# Patient Record
Sex: Female | Born: 1955 | Race: White | Hispanic: No | Marital: Married
Health system: Southern US, Community
[De-identification: ages and names within clinical notes are randomized; demographics above are authoritative.]

## PROBLEM LIST (undated history)

## (undated) DIAGNOSIS — J45909 Unspecified asthma, uncomplicated: Secondary | ICD-10-CM

## (undated) HISTORY — DX: Unspecified asthma, uncomplicated: J45.909

## (undated) HISTORY — PX: CHOLECYSTECTOMY: SHX55

---

## 2016-02-19 DIAGNOSIS — Z131 Encounter for screening for diabetes mellitus: Secondary | ICD-10-CM | POA: Diagnosis not present

## 2016-02-19 DIAGNOSIS — Z13220 Encounter for screening for lipoid disorders: Secondary | ICD-10-CM | POA: Diagnosis not present

## 2016-02-19 DIAGNOSIS — Z0000 Encounter for general adult medical examination without abnormal findings: Secondary | ICD-10-CM | POA: Diagnosis not present

## 2016-02-19 DIAGNOSIS — Z23 Encounter for immunization: Secondary | ICD-10-CM | POA: Diagnosis not present

## 2016-02-19 DIAGNOSIS — Z1211 Encounter for screening for malignant neoplasm of colon: Secondary | ICD-10-CM | POA: Diagnosis not present

## 2016-03-25 ENCOUNTER — Other Ambulatory Visit: Payer: Self-pay | Admitting: Family Medicine

## 2016-03-25 DIAGNOSIS — Z1231 Encounter for screening mammogram for malignant neoplasm of breast: Secondary | ICD-10-CM

## 2016-03-25 DIAGNOSIS — M8588 Other specified disorders of bone density and structure, other site: Secondary | ICD-10-CM | POA: Diagnosis not present

## 2016-03-25 DIAGNOSIS — Z780 Asymptomatic menopausal state: Secondary | ICD-10-CM | POA: Diagnosis not present

## 2016-03-31 ENCOUNTER — Ambulatory Visit
Admission: RE | Admit: 2016-03-31 | Discharge: 2016-03-31 | Disposition: A | Discharge status: Home / Self Care | Payer: PRIVATE HEALTH INSURANCE | Source: Ambulatory Visit | Attending: Family Medicine | Admitting: Family Medicine

## 2016-03-31 DIAGNOSIS — Z1231 Encounter for screening mammogram for malignant neoplasm of breast: Secondary | ICD-10-CM | POA: Diagnosis not present

## 2016-05-22 DIAGNOSIS — Z23 Encounter for immunization: Secondary | ICD-10-CM | POA: Diagnosis not present

## 2017-03-19 ENCOUNTER — Other Ambulatory Visit: Payer: Self-pay | Admitting: Family Medicine

## 2017-03-19 DIAGNOSIS — Z1231 Encounter for screening mammogram for malignant neoplasm of breast: Secondary | ICD-10-CM

## 2017-03-31 DIAGNOSIS — Z961 Presence of intraocular lens: Secondary | ICD-10-CM | POA: Diagnosis not present

## 2017-03-31 DIAGNOSIS — H5000 Unspecified esotropia: Secondary | ICD-10-CM | POA: Diagnosis not present

## 2017-03-31 DIAGNOSIS — H26491 Other secondary cataract, right eye: Secondary | ICD-10-CM | POA: Diagnosis not present

## 2017-04-01 ENCOUNTER — Encounter: Payer: Self-pay | Admitting: Radiology

## 2017-04-01 ENCOUNTER — Ambulatory Visit
Admission: RE | Admit: 2017-04-01 | Discharge: 2017-04-01 | Disposition: A | Discharge status: Home / Self Care | Payer: BLUE CROSS/BLUE SHIELD | Source: Ambulatory Visit | Attending: Family Medicine | Admitting: Family Medicine

## 2017-04-01 DIAGNOSIS — Z1231 Encounter for screening mammogram for malignant neoplasm of breast: Secondary | ICD-10-CM | POA: Diagnosis not present

## 2017-05-21 DIAGNOSIS — Z23 Encounter for immunization: Secondary | ICD-10-CM | POA: Diagnosis not present

## 2017-09-27 DIAGNOSIS — J45909 Unspecified asthma, uncomplicated: Secondary | ICD-10-CM | POA: Diagnosis not present

## 2017-09-27 DIAGNOSIS — Z1211 Encounter for screening for malignant neoplasm of colon: Secondary | ICD-10-CM | POA: Diagnosis not present

## 2017-11-01 DIAGNOSIS — Z961 Presence of intraocular lens: Secondary | ICD-10-CM | POA: Diagnosis not present

## 2017-11-01 DIAGNOSIS — H5213 Myopia, bilateral: Secondary | ICD-10-CM | POA: Diagnosis not present

## 2017-11-01 DIAGNOSIS — H5000 Unspecified esotropia: Secondary | ICD-10-CM | POA: Diagnosis not present

## 2017-11-01 DIAGNOSIS — H26492 Other secondary cataract, left eye: Secondary | ICD-10-CM | POA: Diagnosis not present

## 2017-12-24 DIAGNOSIS — Z01419 Encounter for gynecological examination (general) (routine) without abnormal findings: Secondary | ICD-10-CM | POA: Diagnosis not present

## 2018-01-03 DIAGNOSIS — J029 Acute pharyngitis, unspecified: Secondary | ICD-10-CM | POA: Diagnosis not present

## 2018-01-05 DIAGNOSIS — R631 Polydipsia: Secondary | ICD-10-CM | POA: Diagnosis not present

## 2018-01-05 DIAGNOSIS — R5383 Other fatigue: Secondary | ICD-10-CM | POA: Diagnosis not present

## 2018-01-05 DIAGNOSIS — J029 Acute pharyngitis, unspecified: Secondary | ICD-10-CM | POA: Diagnosis not present

## 2018-01-25 DIAGNOSIS — D473 Essential (hemorrhagic) thrombocythemia: Secondary | ICD-10-CM | POA: Diagnosis not present

## 2018-02-10 ENCOUNTER — Encounter: Payer: Self-pay | Admitting: Internal Medicine

## 2018-02-10 ENCOUNTER — Telehealth: Payer: Self-pay | Admitting: Internal Medicine

## 2018-02-10 NOTE — Telephone Encounter (Signed)
New referral received from Dr. Orland Mustard at [facility] for a dx of thrombocytosis. Pt has been scheduled to see Dr. Julien Nordmann on 7/8 at 1130am. Pt aware to arrive 30 minutes early. Letter mailed.

## 2018-02-21 ENCOUNTER — Other Ambulatory Visit: Payer: Self-pay | Admitting: Family Medicine

## 2018-02-21 ENCOUNTER — Telehealth: Payer: Self-pay | Admitting: Internal Medicine

## 2018-02-21 ENCOUNTER — Other Ambulatory Visit: Payer: Self-pay | Admitting: Medical Oncology

## 2018-02-21 ENCOUNTER — Inpatient Hospital Stay: Payer: BLUE CROSS/BLUE SHIELD

## 2018-02-21 ENCOUNTER — Encounter: Payer: Self-pay | Admitting: Internal Medicine

## 2018-02-21 ENCOUNTER — Inpatient Hospital Stay: Payer: BLUE CROSS/BLUE SHIELD | Attending: Internal Medicine | Admitting: Internal Medicine

## 2018-02-21 VITALS — BP 142/63 | HR 87 | Temp 98.0°F | Resp 20 | Ht 66.0 in | Wt 203.6 lb

## 2018-02-21 DIAGNOSIS — Z1231 Encounter for screening mammogram for malignant neoplasm of breast: Secondary | ICD-10-CM

## 2018-02-21 DIAGNOSIS — D75839 Thrombocytosis, unspecified: Secondary | ICD-10-CM

## 2018-02-21 DIAGNOSIS — D473 Essential (hemorrhagic) thrombocythemia: Secondary | ICD-10-CM

## 2018-02-21 DIAGNOSIS — R7989 Other specified abnormal findings of blood chemistry: Secondary | ICD-10-CM | POA: Diagnosis not present

## 2018-02-21 LAB — CBC WITH DIFFERENTIAL (CANCER CENTER ONLY)
Basophils Absolute: 0.1 10*3/uL (ref 0.0–0.1)
Basophils Relative: 1 %
Eosinophils Absolute: 0.3 10*3/uL (ref 0.0–0.5)
Eosinophils Relative: 3 %
HEMATOCRIT: 43 % (ref 34.8–46.6)
HEMOGLOBIN: 14.3 g/dL (ref 11.6–15.9)
LYMPHS ABS: 2.6 10*3/uL (ref 0.9–3.3)
LYMPHS PCT: 28 %
MCH: 28.9 pg (ref 25.1–34.0)
MCHC: 33.3 g/dL (ref 31.5–36.0)
MCV: 87 fL (ref 79.5–101.0)
Monocytes Absolute: 1 10*3/uL — ABNORMAL HIGH (ref 0.1–0.9)
Monocytes Relative: 11 %
NEUTROS ABS: 5.1 10*3/uL (ref 1.5–6.5)
NEUTROS PCT: 57 %
Platelet Count: 544 10*3/uL — ABNORMAL HIGH (ref 145–400)
RBC: 4.94 MIL/uL (ref 3.70–5.45)
RDW: 14.1 % (ref 11.2–14.5)
WBC Count: 9 10*3/uL (ref 3.9–10.3)

## 2018-02-21 LAB — LACTATE DEHYDROGENASE: LDH: 193 U/L — ABNORMAL HIGH (ref 98–192)

## 2018-02-21 LAB — CMP (CANCER CENTER ONLY)
ALT: 13 U/L (ref 0–44)
ANION GAP: 8 (ref 5–15)
AST: 21 U/L (ref 15–41)
Albumin: 4.3 g/dL (ref 3.5–5.0)
Alkaline Phosphatase: 94 U/L (ref 38–126)
BUN: 13 mg/dL (ref 8–23)
CHLORIDE: 104 mmol/L (ref 98–111)
CO2: 30 mmol/L (ref 22–32)
Calcium: 10 mg/dL (ref 8.9–10.3)
Creatinine: 0.96 mg/dL (ref 0.44–1.00)
GFR, Estimated: >60 mL/min (ref >60)
GLUCOSE: 92 mg/dL (ref 70–99)
POTASSIUM: 4.2 mmol/L (ref 3.5–5.1)
SODIUM: 142 mmol/L (ref 135–145)
Total Bilirubin: 0.3 mg/dL (ref 0.3–1.2)
Total Protein: 7.8 g/dL (ref 6.5–8.1)

## 2018-02-21 LAB — IRON AND TIBC
Iron: 56 ug/dL (ref 41–142)
Saturation Ratios: 17 % — ABNORMAL LOW (ref 21–57)
TIBC: 339 ug/dL (ref 236–444)
UIBC: 283 ug/dL

## 2018-02-21 LAB — FERRITIN: FERRITIN: 97 ng/mL (ref 11–307)

## 2018-02-21 NOTE — Telephone Encounter (Signed)
Gave patient avs report and appointments for July.  °

## 2018-02-21 NOTE — Progress Notes (Signed)
[place] Telephone:(336) [phone]   Fax:(336) [phone]  CONSULT NOTE  REFERRING PHYSICIAN: London Pepper, MD  REASON FOR CONSULTATION:  62 years old white female with elevated platelets count.  HPI Claris Pech is a 62 y.o. female with past medical history significant for asthma as well as cholecystectomy.  The patient was seen by her primary care physician in May 2019 complaining of sore throat and was found to have streptococcal infection.  She was treated with amoxicillin.  During her evaluation CBC was performed and it showed elevated platelets count of 569,000.  The patient denied having any previous history of thrombocytosis.  She recovered well from her streptococcal infection.  She was referred to me today for further evaluation and recommendation regarding her thrombocytosis. When seen today the patient is feeling fine with no concerning complaints except for mild fatigue.  She denied having any chest pain, shortness of breath, cough or hemoptysis.  She denied having any nausea, vomiting, diarrhea or constipation.  She has no fever or chills.  She has no headache or visual changes.  She denied having any bleeding, bruises or ecchymosis.  She does not have any personal history of blood disorders. Family history significant for mother with hypertension and stroke and father with COPD. The patient is married and has 2 daughters.  She is currently retired and used to work for [employer].  She has no history for smoking, alcohol or drug abuse.  HPI  Past Medical History:  Diagnosis Date  . Asthma     Past Surgical History:  Procedure Laterality Date  . CHOLECYSTECTOMY      Family History  Problem Relation Age of Onset  . Stroke Mother   . COPD Father     Social History Social History   Tobacco Use  . Smoking status: Not on file  Substance Use Topics  . Alcohol use: Not on file  . Drug use: Not on file    Not on File  No current outpatient  medications on file.   No current facility-administered medications for this visit.     Review of Systems  Constitutional: positive for fatigue Eyes: negative Ears, nose, mouth, throat, and face: negative Respiratory: negative Cardiovascular: negative Gastrointestinal: negative Genitourinary:negative Integument/breast: negative Hematologic/lymphatic: negative Musculoskeletal:negative Neurological: negative Behavioral/Psych: negative Endocrine: negative Allergic/Immunologic: negative  Physical Exam  NLZ:JQBHA, healthy, no distress, well nourished, well developed and anxious SKIN: skin color, texture, turgor are normal, no rashes or significant lesions HEAD: Normocephalic, No masses, lesions, tenderness or abnormalities EYES: normal, PERRLA, Conjunctiva are pink and non-injected EARS: External ears normal, Canals clear OROPHARYNX:no exudate, no erythema and lips, buccal mucosa, and tongue normal  NECK: supple, no adenopathy, no JVD LYMPH:  no palpable lymphadenopathy, no hepatosplenomegaly BREAST:not examined LUNGS: clear to auscultation , and palpation HEART: regular rate & rhythm, no murmurs and no gallops ABDOMEN:abdomen soft, non-tender, normal bowel sounds and no masses or organomegaly BACK: Back symmetric, no curvature., No CVA tenderness EXTREMITIES:no joint deformities, effusion, or inflammation, no edema  NEURO: alert & oriented x 3 with fluent speech, no focal motor/sensory deficits  PERFORMANCE STATUS: ECOG 0  LABORATORY DATA: Lab Results  Component Value Date   WBC 9.0 02/21/2018   HGB 14.3 02/21/2018   HCT 43.0 02/21/2018   MCV 87.0 02/21/2018   PLT 544 (H) 02/21/2018      Chemistry   No results found for: NA, K, CL, CO2, BUN, CREATININE, GLU No results found for: CALCIUM, ALKPHOS, AST, ALT,  BILITOT     RADIOGRAPHIC STUDIES: No results found.  ASSESSMENT: This is a very pleasant 62 years old white female presented for evaluation of thrombocytosis  questionable for essential thrombocythemia versus reactive thrombocytosis.   PLAN: I had a lengthy discussion with the patient today about her condition and further investigation to rule out any underlying essential thrombocythemia. I will do a repeat CBC which was performed today and that showed persistent elevation of the platelet count, currently 544,000. I recommended for the patient to proceed with further lab work including Jak 2 mutation, comprehensive metabolic panel, iron study and ferritin I will see her back for follow-up visit next week for reevaluation and repeat CBC. The patient was advised to call immediately if she has any concerning symptoms in the interval. The patient voices understanding of current disease status and treatment options and is in agreement with the current care plan.  All questions were answered. The patient knows to call the clinic with any problems, questions or concerns. We can certainly see the patient much sooner if necessary.  Thank you so much for allowing me to participate in the care of Dennys Traughber. I will continue to follow up the patient with you and assist in her care.  I spent 40 minutes counseling the patient face to face. The total time spent in the appointment was 60 minutes.  Disclaimer: This note was dictated with voice recognition software. Similar sounding words can inadvertently be transcribed and may not be corrected upon review.   Eilleen Kempf February 21, 2018, 11:29 AM

## 2018-03-02 ENCOUNTER — Inpatient Hospital Stay (HOSPITAL_BASED_OUTPATIENT_CLINIC_OR_DEPARTMENT_OTHER): Payer: BLUE CROSS/BLUE SHIELD | Admitting: Internal Medicine

## 2018-03-02 ENCOUNTER — Telehealth: Payer: Self-pay

## 2018-03-02 ENCOUNTER — Encounter: Payer: Self-pay | Admitting: Internal Medicine

## 2018-03-02 ENCOUNTER — Inpatient Hospital Stay: Payer: BLUE CROSS/BLUE SHIELD

## 2018-03-02 VITALS — BP 134/77 | HR 75 | Temp 98.4°F | Resp 17 | Ht 66.0 in | Wt 201.5 lb

## 2018-03-02 DIAGNOSIS — R7989 Other specified abnormal findings of blood chemistry: Secondary | ICD-10-CM | POA: Diagnosis not present

## 2018-03-02 DIAGNOSIS — D75838 Other thrombocytosis: Secondary | ICD-10-CM

## 2018-03-02 DIAGNOSIS — D75839 Thrombocytosis, unspecified: Secondary | ICD-10-CM

## 2018-03-02 DIAGNOSIS — D473 Essential (hemorrhagic) thrombocythemia: Secondary | ICD-10-CM

## 2018-03-02 LAB — CBC WITH DIFFERENTIAL (CANCER CENTER ONLY)
BASOS PCT: 1 %
Basophils Absolute: 0.1 10*3/uL (ref 0.0–0.1)
EOS PCT: 3 %
Eosinophils Absolute: 0.2 10*3/uL (ref 0.0–0.5)
HCT: 42.4 % (ref 34.8–46.6)
Hemoglobin: 14.1 g/dL (ref 11.6–15.9)
Lymphocytes Relative: 35 %
Lymphs Abs: 2.4 10*3/uL (ref 0.9–3.3)
MCH: 28.5 pg (ref 25.1–34.0)
MCHC: 33.2 g/dL (ref 31.5–36.0)
MCV: 85.9 fL (ref 79.5–101.0)
MONO ABS: 0.7 10*3/uL (ref 0.1–0.9)
Monocytes Relative: 10 %
Neutro Abs: 3.4 10*3/uL (ref 1.5–6.5)
Neutrophils Relative %: 51 %
PLATELETS: 523 10*3/uL — AB (ref 145–400)
RBC: 4.94 MIL/uL (ref 3.70–5.45)
RDW: 14.2 % (ref 11.2–14.5)
WBC Count: 6.8 10*3/uL (ref 3.9–10.3)

## 2018-03-02 NOTE — Progress Notes (Signed)
[place] Telephone:([phone]   Fax:(336) [phone]  OFFICE PROGRESS NOTE  Sheila Pepper, MD [name] [place] [address]  DIAGNOSIS: Thrombocytosis most likely reactive in nature.  PRIOR THERAPY: None  CURRENT THERAPY: Observation  INTERVAL HISTORY: Sheila Goodwin 62 y.o. female returns to the clinic today for follow-up visit.  The patient is feeling fine today with no concerning complaints.  She denied having any chest pain, shortness breath, cough or hemoptysis.  She denied having any nausea, vomiting, diarrhea or constipation.  She denied having any bleeding.  She had CBC, iron study as well as molecular studies for Jak 2 mutation performed recently and she is here for evaluation and discussion of her lab results and recommendation regarding her condition.  MEDICAL HISTORY: Past Medical History:  Diagnosis Date  . Asthma     ALLERGIES:  has No Known Allergies.  MEDICATIONS:  Current Outpatient Medications  Medication Sig Dispense Refill  . albuterol (PROVENTIL HFA;VENTOLIN HFA) 108 (90 Base) MCG/ACT inhaler Inhale 2 puffs into the lungs every 4 (four) hours as needed for wheezing or shortness of breath.    . Calcium-Vitamin D-Vitamin K 500-500-40 MG-UNT-MCG CHEW Chew 1 Dose by mouth daily.    . fexofenadine (ALLEGRA) 60 MG tablet Take 60 mg by mouth 2 (two) times daily.    . mometasone-formoterol (DULERA) 100-5 MCG/ACT AERO Inhale 2 puffs into the lungs 2 (two) times daily.    . Multiple Vitamin (MULTIVITAMIN) tablet Take 1 tablet by mouth daily.    Marland Kitchen OVER THE COUNTER MEDICATION Take 1 tablet by mouth daily. fiber    . OVER THE COUNTER MEDICATION Take 1 capsule by mouth daily. Probiotic     No current facility-administered medications for this visit.     SURGICAL HISTORY:  Past Surgical History:  Procedure Laterality Date  . CHOLECYSTECTOMY      REVIEW OF SYSTEMS:  A comprehensive review of systems was negative.    PHYSICAL EXAMINATION: General appearance: alert, cooperative and no distress Head: Normocephalic, without obvious abnormality, atraumatic Neck: no adenopathy, no JVD, supple, symmetrical, trachea midline and thyroid not enlarged, symmetric, no tenderness/mass/nodules Lymph nodes: Cervical, supraclavicular, and axillary nodes normal. Resp: clear to auscultation bilaterally Back: symmetric, no curvature. ROM normal. No CVA tenderness. Cardio: regular rate and rhythm, S1, S2 normal, no murmur, click, rub or gallop GI: soft, non-tender; bowel sounds normal; no masses,  no organomegaly Extremities: extremities normal, atraumatic, no cyanosis or edema  ECOG PERFORMANCE STATUS: 0 - Asymptomatic  Blood pressure 134/77, pulse 75, temperature 98.4 F (36.9 C), temperature source Oral, resp. rate 17, height 5\' 6"  (1.676 m), weight 201 lb 8 oz (91.4 kg), SpO2 98 %.  LABORATORY DATA: Lab Results  Component Value Date   WBC 6.8 03/02/2018   HGB 14.1 03/02/2018   HCT 42.4 03/02/2018   MCV 85.9 03/02/2018   PLT 523 (H) 03/02/2018      Chemistry      Component Value Date/Time   NA 142 02/21/2018 1259   K 4.2 02/21/2018 1259   CL 104 02/21/2018 1259   CO2 30 02/21/2018 1259   BUN 13 02/21/2018 1259   CREATININE 0.96 02/21/2018 1259      Component Value Date/Time   CALCIUM 10.0 02/21/2018 1259   ALKPHOS 94 02/21/2018 1259   AST 21 02/21/2018 1259   ALT 13 02/21/2018 1259   BILITOT 0.3 02/21/2018 1259       RADIOGRAPHIC STUDIES: No results  found.  ASSESSMENT AND PLAN: This is a very pleasant 62 years old white female with persistent thrombocytosis most likely reactive in nature and could be secondary to mild iron deficiency anemia.  The patient had molecular studies for Jak 2 mutation to rule out myeloproliferative disorder and she has negative Jak 2 mutation. Her iron study is unremarkable except for low iron saturation. I recommended for the patient to continue on observation with  routine follow-up visit by her primary care physician at this point. I will see the patient on as-needed basis in the future if she has significant elevation of her platelets count over 700,000. She was advised to call immediately if she has any concerning symptoms. The patient voices understanding of current disease status and treatment options and is in agreement with the current care plan.  All questions were answered. The patient knows to call the clinic with any problems, questions or concerns. We can certainly see the patient much sooner if necessary.  I spent 10 minutes counseling the patient face to face. The total time spent in the appointment was 15 minutes.  Disclaimer: This note was dictated with voice recognition software. Similar sounding words can inadvertently be transcribed and may not be corrected upon review.

## 2018-03-02 NOTE — Telephone Encounter (Signed)
Follow-up visit on as-needed basis. Per 7/17 no los

## 2018-03-17 DIAGNOSIS — Z136 Encounter for screening for cardiovascular disorders: Secondary | ICD-10-CM | POA: Diagnosis not present

## 2018-03-17 DIAGNOSIS — Z0000 Encounter for general adult medical examination without abnormal findings: Secondary | ICD-10-CM | POA: Diagnosis not present

## 2018-03-17 DIAGNOSIS — Z23 Encounter for immunization: Secondary | ICD-10-CM | POA: Diagnosis not present

## 2018-03-18 LAB — JAK 2 V617F (GENPATH)

## 2018-04-04 ENCOUNTER — Ambulatory Visit
Admission: RE | Admit: 2018-04-04 | Discharge: 2018-04-04 | Disposition: A | Discharge status: Home / Self Care | Payer: BLUE CROSS/BLUE SHIELD | Source: Ambulatory Visit | Attending: Family Medicine | Admitting: Family Medicine

## 2018-04-04 DIAGNOSIS — Z1231 Encounter for screening mammogram for malignant neoplasm of breast: Secondary | ICD-10-CM | POA: Diagnosis not present

## 2018-04-29 DIAGNOSIS — Z23 Encounter for immunization: Secondary | ICD-10-CM | POA: Diagnosis not present

## 2018-05-03 DIAGNOSIS — N952 Postmenopausal atrophic vaginitis: Secondary | ICD-10-CM | POA: Diagnosis not present

## 2018-05-03 DIAGNOSIS — N941 Unspecified dyspareunia: Secondary | ICD-10-CM | POA: Diagnosis not present

## 2018-05-25 DIAGNOSIS — M8588 Other specified disorders of bone density and structure, other site: Secondary | ICD-10-CM | POA: Diagnosis not present

## 2018-06-27 DIAGNOSIS — Z23 Encounter for immunization: Secondary | ICD-10-CM | POA: Diagnosis not present

## 2018-08-23 DIAGNOSIS — L309 Dermatitis, unspecified: Secondary | ICD-10-CM | POA: Diagnosis not present

## 2019-03-07 ENCOUNTER — Other Ambulatory Visit: Payer: Self-pay | Admitting: Family Medicine

## 2019-03-07 DIAGNOSIS — Z1231 Encounter for screening mammogram for malignant neoplasm of breast: Secondary | ICD-10-CM

## 2019-03-09 DIAGNOSIS — Z01411 Encounter for gynecological examination (general) (routine) with abnormal findings: Secondary | ICD-10-CM | POA: Diagnosis not present

## 2019-03-22 DIAGNOSIS — Z0000 Encounter for general adult medical examination without abnormal findings: Secondary | ICD-10-CM | POA: Diagnosis not present

## 2019-04-05 DIAGNOSIS — Z1211 Encounter for screening for malignant neoplasm of colon: Secondary | ICD-10-CM | POA: Diagnosis not present

## 2019-04-05 DIAGNOSIS — Z13220 Encounter for screening for lipoid disorders: Secondary | ICD-10-CM | POA: Diagnosis not present

## 2019-04-05 DIAGNOSIS — D473 Essential (hemorrhagic) thrombocythemia: Secondary | ICD-10-CM | POA: Diagnosis not present

## 2019-04-20 ENCOUNTER — Ambulatory Visit
Admission: RE | Admit: 2019-04-20 | Discharge: 2019-04-20 | Disposition: A | Discharge status: Home / Self Care | Payer: BC Managed Care – PPO | Source: Ambulatory Visit | Attending: Family Medicine | Admitting: Family Medicine

## 2019-04-20 ENCOUNTER — Other Ambulatory Visit: Payer: Self-pay

## 2019-04-20 DIAGNOSIS — Z1231 Encounter for screening mammogram for malignant neoplasm of breast: Secondary | ICD-10-CM | POA: Diagnosis not present

## 2019-10-02 DIAGNOSIS — H903 Sensorineural hearing loss, bilateral: Secondary | ICD-10-CM | POA: Diagnosis not present

## 2020-03-15 ENCOUNTER — Other Ambulatory Visit: Payer: Self-pay | Admitting: Family Medicine

## 2020-03-15 DIAGNOSIS — Z1231 Encounter for screening mammogram for malignant neoplasm of breast: Secondary | ICD-10-CM

## 2020-03-15 DIAGNOSIS — Z01411 Encounter for gynecological examination (general) (routine) with abnormal findings: Secondary | ICD-10-CM | POA: Diagnosis not present

## 2020-04-01 DIAGNOSIS — Z0000 Encounter for general adult medical examination without abnormal findings: Secondary | ICD-10-CM | POA: Diagnosis not present

## 2020-04-01 DIAGNOSIS — Z13220 Encounter for screening for lipoid disorders: Secondary | ICD-10-CM | POA: Diagnosis not present

## 2020-04-01 DIAGNOSIS — D473 Essential (hemorrhagic) thrombocythemia: Secondary | ICD-10-CM | POA: Diagnosis not present

## 2020-04-03 DIAGNOSIS — B977 Papillomavirus as the cause of diseases classified elsewhere: Secondary | ICD-10-CM | POA: Diagnosis not present

## 2020-04-03 DIAGNOSIS — N952 Postmenopausal atrophic vaginitis: Secondary | ICD-10-CM | POA: Diagnosis not present

## 2020-04-03 DIAGNOSIS — R87810 Cervical high risk human papillomavirus (HPV) DNA test positive: Secondary | ICD-10-CM | POA: Diagnosis not present

## 2020-04-04 ENCOUNTER — Other Ambulatory Visit: Payer: Self-pay | Admitting: Family Medicine

## 2020-04-04 DIAGNOSIS — M8580 Other specified disorders of bone density and structure, unspecified site: Secondary | ICD-10-CM

## 2020-04-08 DIAGNOSIS — M25561 Pain in right knee: Secondary | ICD-10-CM | POA: Diagnosis not present

## 2020-04-23 ENCOUNTER — Ambulatory Visit
Admission: RE | Admit: 2020-04-23 | Discharge: 2020-04-23 | Disposition: A | Discharge status: Home / Self Care | Payer: BC Managed Care – PPO | Source: Ambulatory Visit | Attending: Family Medicine | Admitting: Family Medicine

## 2020-04-23 ENCOUNTER — Other Ambulatory Visit: Payer: Self-pay

## 2020-04-23 DIAGNOSIS — Z1231 Encounter for screening mammogram for malignant neoplasm of breast: Secondary | ICD-10-CM

## 2020-05-01 DIAGNOSIS — Z1211 Encounter for screening for malignant neoplasm of colon: Secondary | ICD-10-CM | POA: Diagnosis not present

## 2020-05-15 DIAGNOSIS — L989 Disorder of the skin and subcutaneous tissue, unspecified: Secondary | ICD-10-CM | POA: Diagnosis not present

## 2020-05-16 DIAGNOSIS — Z23 Encounter for immunization: Secondary | ICD-10-CM | POA: Diagnosis not present

## 2020-05-17 DIAGNOSIS — B081 Molluscum contagiosum: Secondary | ICD-10-CM | POA: Diagnosis not present

## 2020-06-18 DIAGNOSIS — B081 Molluscum contagiosum: Secondary | ICD-10-CM | POA: Diagnosis not present

## 2020-07-15 ENCOUNTER — Other Ambulatory Visit: Payer: BC Managed Care – PPO

## 2020-12-04 DIAGNOSIS — H903 Sensorineural hearing loss, bilateral: Secondary | ICD-10-CM | POA: Diagnosis not present

## 2021-02-12 DIAGNOSIS — Z961 Presence of intraocular lens: Secondary | ICD-10-CM | POA: Diagnosis not present

## 2021-02-12 DIAGNOSIS — H50011 Monocular esotropia, right eye: Secondary | ICD-10-CM | POA: Diagnosis not present

## 2021-03-18 DIAGNOSIS — Z01419 Encounter for gynecological examination (general) (routine) without abnormal findings: Secondary | ICD-10-CM | POA: Diagnosis not present

## 2021-03-18 DIAGNOSIS — N841 Polyp of cervix uteri: Secondary | ICD-10-CM | POA: Diagnosis not present

## 2021-03-18 DIAGNOSIS — M8580 Other specified disorders of bone density and structure, unspecified site: Secondary | ICD-10-CM | POA: Diagnosis not present

## 2021-03-18 DIAGNOSIS — Z8619 Personal history of other infectious and parasitic diseases: Secondary | ICD-10-CM | POA: Diagnosis not present

## 2021-03-19 ENCOUNTER — Other Ambulatory Visit: Payer: Self-pay | Admitting: Family Medicine

## 2021-03-19 DIAGNOSIS — Z1231 Encounter for screening mammogram for malignant neoplasm of breast: Secondary | ICD-10-CM

## 2021-03-24 ENCOUNTER — Other Ambulatory Visit: Payer: Self-pay | Admitting: Family Medicine

## 2021-03-24 DIAGNOSIS — M8580 Other specified disorders of bone density and structure, unspecified site: Secondary | ICD-10-CM

## 2021-03-27 ENCOUNTER — Ambulatory Visit
Admission: RE | Admit: 2021-03-27 | Discharge: 2021-03-27 | Disposition: A | Discharge status: Home / Self Care | Payer: Medicare Other | Source: Ambulatory Visit | Attending: Family Medicine | Admitting: Family Medicine

## 2021-03-27 ENCOUNTER — Other Ambulatory Visit: Payer: Self-pay

## 2021-03-27 DIAGNOSIS — M8589 Other specified disorders of bone density and structure, multiple sites: Secondary | ICD-10-CM | POA: Diagnosis not present

## 2021-03-27 DIAGNOSIS — Z780 Asymptomatic menopausal state: Secondary | ICD-10-CM | POA: Diagnosis not present

## 2021-03-27 DIAGNOSIS — M8580 Other specified disorders of bone density and structure, unspecified site: Secondary | ICD-10-CM

## 2021-04-03 DIAGNOSIS — E785 Hyperlipidemia, unspecified: Secondary | ICD-10-CM | POA: Diagnosis not present

## 2021-04-03 DIAGNOSIS — D473 Essential (hemorrhagic) thrombocythemia: Secondary | ICD-10-CM | POA: Diagnosis not present

## 2021-04-03 DIAGNOSIS — M8580 Other specified disorders of bone density and structure, unspecified site: Secondary | ICD-10-CM | POA: Diagnosis not present

## 2021-04-03 DIAGNOSIS — Z1211 Encounter for screening for malignant neoplasm of colon: Secondary | ICD-10-CM | POA: Diagnosis not present

## 2021-04-03 DIAGNOSIS — J45909 Unspecified asthma, uncomplicated: Secondary | ICD-10-CM | POA: Diagnosis not present

## 2021-04-03 DIAGNOSIS — Z0000 Encounter for general adult medical examination without abnormal findings: Secondary | ICD-10-CM | POA: Diagnosis not present

## 2021-04-07 DIAGNOSIS — Z1211 Encounter for screening for malignant neoplasm of colon: Secondary | ICD-10-CM | POA: Diagnosis not present

## 2021-05-02 DIAGNOSIS — Z23 Encounter for immunization: Secondary | ICD-10-CM | POA: Diagnosis not present

## 2021-05-08 ENCOUNTER — Ambulatory Visit
Admission: RE | Admit: 2021-05-08 | Discharge: 2021-05-08 | Disposition: A | Discharge status: Home / Self Care | Payer: Medicare Other | Source: Ambulatory Visit | Attending: Family Medicine | Admitting: Family Medicine

## 2021-05-08 ENCOUNTER — Other Ambulatory Visit: Payer: Self-pay

## 2021-05-08 DIAGNOSIS — Z1231 Encounter for screening mammogram for malignant neoplasm of breast: Secondary | ICD-10-CM | POA: Diagnosis not present

## 2021-10-21 DIAGNOSIS — H524 Presbyopia: Secondary | ICD-10-CM | POA: Diagnosis not present

## 2021-11-25 ENCOUNTER — Emergency Department (HOSPITAL_COMMUNITY): Payer: Medicare Other

## 2021-11-25 ENCOUNTER — Inpatient Hospital Stay (HOSPITAL_COMMUNITY)
Admission: EM | Admit: 2021-11-25 | Discharge: 2021-12-03 | DRG: 956 | Disposition: A | Discharge status: Skilled Nursing Facility | Payer: Medicare Other | Attending: Internal Medicine | Admitting: Internal Medicine

## 2021-11-25 ENCOUNTER — Encounter (HOSPITAL_COMMUNITY): Payer: Self-pay | Admitting: Emergency Medicine

## 2021-11-25 DIAGNOSIS — Z823 Family history of stroke: Secondary | ICD-10-CM | POA: Diagnosis not present

## 2021-11-25 DIAGNOSIS — Z7722 Contact with and (suspected) exposure to environmental tobacco smoke (acute) (chronic): Secondary | ICD-10-CM | POA: Diagnosis present

## 2021-11-25 DIAGNOSIS — Z6835 Body mass index (BMI) 35.0-35.9, adult: Secondary | ICD-10-CM

## 2021-11-25 DIAGNOSIS — Z471 Aftercare following joint replacement surgery: Secondary | ICD-10-CM | POA: Diagnosis not present

## 2021-11-25 DIAGNOSIS — Z96641 Presence of right artificial hip joint: Secondary | ICD-10-CM | POA: Diagnosis not present

## 2021-11-25 DIAGNOSIS — S3219XA Other fracture of sacrum, initial encounter for closed fracture: Secondary | ICD-10-CM | POA: Diagnosis present

## 2021-11-25 DIAGNOSIS — M48061 Spinal stenosis, lumbar region without neurogenic claudication: Secondary | ICD-10-CM | POA: Diagnosis not present

## 2021-11-25 DIAGNOSIS — D62 Acute posthemorrhagic anemia: Secondary | ICD-10-CM

## 2021-11-25 DIAGNOSIS — Z79899 Other long term (current) drug therapy: Secondary | ICD-10-CM | POA: Diagnosis not present

## 2021-11-25 DIAGNOSIS — D72829 Elevated white blood cell count, unspecified: Secondary | ICD-10-CM

## 2021-11-25 DIAGNOSIS — W19XXXA Unspecified fall, initial encounter: Secondary | ICD-10-CM | POA: Diagnosis not present

## 2021-11-25 DIAGNOSIS — Z825 Family history of asthma and other chronic lower respiratory diseases: Secondary | ICD-10-CM

## 2021-11-25 DIAGNOSIS — Y92009 Unspecified place in unspecified non-institutional (private) residence as the place of occurrence of the external cause: Secondary | ICD-10-CM | POA: Diagnosis not present

## 2021-11-25 DIAGNOSIS — S72002A Fracture of unspecified part of neck of left femur, initial encounter for closed fracture: Secondary | ICD-10-CM | POA: Diagnosis present

## 2021-11-25 DIAGNOSIS — M25551 Pain in right hip: Secondary | ICD-10-CM | POA: Diagnosis not present

## 2021-11-25 DIAGNOSIS — Z4889 Encounter for other specified surgical aftercare: Secondary | ICD-10-CM | POA: Diagnosis not present

## 2021-11-25 DIAGNOSIS — E669 Obesity, unspecified: Secondary | ICD-10-CM | POA: Diagnosis not present

## 2021-11-25 DIAGNOSIS — M160 Bilateral primary osteoarthritis of hip: Secondary | ICD-10-CM | POA: Diagnosis not present

## 2021-11-25 DIAGNOSIS — E739 Lactose intolerance, unspecified: Secondary | ICD-10-CM | POA: Diagnosis not present

## 2021-11-25 DIAGNOSIS — R2689 Other abnormalities of gait and mobility: Secondary | ICD-10-CM | POA: Diagnosis not present

## 2021-11-25 DIAGNOSIS — G8911 Acute pain due to trauma: Secondary | ICD-10-CM | POA: Diagnosis not present

## 2021-11-25 DIAGNOSIS — J45909 Unspecified asthma, uncomplicated: Secondary | ICD-10-CM | POA: Diagnosis present

## 2021-11-25 DIAGNOSIS — S72001D Fracture of unspecified part of neck of right femur, subsequent encounter for closed fracture with routine healing: Secondary | ICD-10-CM | POA: Diagnosis not present

## 2021-11-25 DIAGNOSIS — S72001A Fracture of unspecified part of neck of right femur, initial encounter for closed fracture: Secondary | ICD-10-CM | POA: Diagnosis not present

## 2021-11-25 DIAGNOSIS — J4520 Mild intermittent asthma, uncomplicated: Secondary | ICD-10-CM

## 2021-11-25 DIAGNOSIS — S72041A Displaced fracture of base of neck of right femur, initial encounter for closed fracture: Secondary | ICD-10-CM | POA: Diagnosis not present

## 2021-11-25 DIAGNOSIS — R079 Chest pain, unspecified: Secondary | ICD-10-CM | POA: Diagnosis not present

## 2021-11-25 DIAGNOSIS — Z9049 Acquired absence of other specified parts of digestive tract: Secondary | ICD-10-CM | POA: Diagnosis not present

## 2021-11-25 DIAGNOSIS — W010XXA Fall on same level from slipping, tripping and stumbling without subsequent striking against object, initial encounter: Secondary | ICD-10-CM | POA: Diagnosis present

## 2021-11-25 DIAGNOSIS — M6281 Muscle weakness (generalized): Secondary | ICD-10-CM | POA: Diagnosis not present

## 2021-11-25 DIAGNOSIS — Z1159 Encounter for screening for other viral diseases: Secondary | ICD-10-CM | POA: Diagnosis not present

## 2021-11-25 DIAGNOSIS — M5136 Other intervertebral disc degeneration, lumbar region: Secondary | ICD-10-CM | POA: Diagnosis not present

## 2021-11-25 DIAGNOSIS — M545 Low back pain, unspecified: Secondary | ICD-10-CM | POA: Diagnosis not present

## 2021-11-25 DIAGNOSIS — E46 Unspecified protein-calorie malnutrition: Secondary | ICD-10-CM | POA: Diagnosis not present

## 2021-11-25 DIAGNOSIS — S7291XA Unspecified fracture of right femur, initial encounter for closed fracture: Secondary | ICD-10-CM | POA: Diagnosis not present

## 2021-11-25 LAB — PROTIME-INR
INR: 1.1 (ref 0.8–1.2)
Prothrombin Time: 13.7 seconds (ref 11.4–15.2)

## 2021-11-25 LAB — CBC WITH DIFFERENTIAL/PLATELET
Abs Immature Granulocytes: 0.08 10*3/uL — ABNORMAL HIGH (ref 0.00–0.07)
Basophils Absolute: 0.1 10*3/uL (ref 0.0–0.1)
Basophils Relative: 1 %
Eosinophils Absolute: 0.1 10*3/uL (ref 0.0–0.5)
Eosinophils Relative: 1 %
HCT: 47.3 % — ABNORMAL HIGH (ref 36.0–46.0)
Hemoglobin: 15.4 g/dL — ABNORMAL HIGH (ref 12.0–15.0)
Immature Granulocytes: 1 %
Lymphocytes Relative: 20 %
Lymphs Abs: 3.2 10*3/uL (ref 0.7–4.0)
MCH: 28.1 pg (ref 26.0–34.0)
MCHC: 32.6 g/dL (ref 30.0–36.0)
MCV: 86.2 fL (ref 80.0–100.0)
Monocytes Absolute: 1.5 10*3/uL — ABNORMAL HIGH (ref 0.1–1.0)
Monocytes Relative: 10 %
Neutro Abs: 11 10*3/uL — ABNORMAL HIGH (ref 1.7–7.7)
Neutrophils Relative %: 67 %
Platelets: 613 10*3/uL — ABNORMAL HIGH (ref 150–400)
RBC: 5.49 MIL/uL — ABNORMAL HIGH (ref 3.87–5.11)
RDW: 13.6 % (ref 11.5–15.5)
WBC: 16 10*3/uL — ABNORMAL HIGH (ref 4.0–10.5)
nRBC: 0 % (ref 0.0–0.2)

## 2021-11-25 LAB — COMPREHENSIVE METABOLIC PANEL
ALT: 18 U/L (ref 0–44)
AST: 33 U/L (ref 15–41)
Albumin: 4.1 g/dL (ref 3.5–5.0)
Alkaline Phosphatase: 88 U/L (ref 38–126)
Anion gap: 14 (ref 5–15)
BUN: 30 mg/dL — ABNORMAL HIGH (ref 8–23)
CO2: 22 mmol/L (ref 22–32)
Calcium: 10.1 mg/dL (ref 8.9–10.3)
Chloride: 104 mmol/L (ref 98–111)
Creatinine, Ser: 1.11 mg/dL — ABNORMAL HIGH (ref 0.44–1.00)
GFR, Estimated: 55 mL/min — ABNORMAL LOW (ref >60)
Glucose, Bld: 104 mg/dL — ABNORMAL HIGH (ref 70–99)
Potassium: 3.7 mmol/L (ref 3.5–5.1)
Sodium: 140 mmol/L (ref 135–145)
Total Bilirubin: 1.3 mg/dL — ABNORMAL HIGH (ref 0.3–1.2)
Total Protein: 7.8 g/dL (ref 6.5–8.1)

## 2021-11-25 MED ORDER — MORPHINE SULFATE (PF) 2 MG/ML IV SOLN
1.0000 mg | INTRAVENOUS | Status: DC | PRN
  Administered 2021-11-26 – 2021-11-28 (×5): 2 mg via INTRAVENOUS
  Filled 2021-11-25 (×5): qty 1

## 2021-11-25 MED ORDER — ACETAMINOPHEN 650 MG RE SUPP: 650.0000 mg | RECTAL | Status: DC | PRN

## 2021-11-25 MED ORDER — OYSTER SHELL CALCIUM/D3 500-5 MG-MCG PO TABS
1.0000 | ORAL_TABLET | Freq: Every day | ORAL | Status: DC
  Administered 2021-11-26 – 2021-12-03 (×7): 1 via ORAL
  Filled 2021-11-25 (×8): qty 1

## 2021-11-25 MED ORDER — MORPHINE SULFATE (PF) 2 MG/ML IV SOLN: 0.5000 mg | INTRAVENOUS | Status: DC | PRN

## 2021-11-25 MED ORDER — ACETAMINOPHEN 325 MG PO TABS
650.0000 mg | ORAL_TABLET | ORAL | Status: DC | PRN
  Administered 2021-11-26 – 2021-12-03 (×3): 650 mg via ORAL
  Filled 2021-11-25 (×2): qty 2

## 2021-11-25 MED ORDER — SODIUM CHLORIDE 0.9 % IV SOLN: INTRAVENOUS | Status: DC

## 2021-11-25 MED ORDER — MAGNESIUM HYDROXIDE 400 MG/5ML PO SUSP
30.0000 mL | Freq: Every day | ORAL | Status: DC | PRN
  Administered 2021-11-26: 30 mL via ORAL
  Filled 2021-11-25: qty 30

## 2021-11-25 MED ORDER — ACETAMINOPHEN 325 MG PO TABS: 650.0000 mg | ORAL_TABLET | ORAL | Status: DC | PRN

## 2021-11-25 MED ORDER — HYDROCODONE-ACETAMINOPHEN 5-325 MG PO TABS
1.0000 | ORAL_TABLET | Freq: Four times a day (QID) | ORAL | Status: DC | PRN
  Administered 2021-11-27: 2 via ORAL
  Filled 2021-11-25 (×2): qty 2

## 2021-11-25 MED ORDER — TRAZODONE HCL 50 MG PO TABS
25.0000 mg | ORAL_TABLET | Freq: Every evening | ORAL | Status: DC | PRN
Start: 2021-11-25 — End: 2021-12-03
  Administered 2021-11-29: 25 mg via ORAL
  Filled 2021-11-25 (×3): qty 1

## 2021-11-25 MED ORDER — FENTANYL CITRATE PF 50 MCG/ML IJ SOSY
50.0000 ug | PREFILLED_SYRINGE | Freq: Once | INTRAMUSCULAR | Status: AC
  Administered 2021-11-25: 50 ug via INTRAVENOUS
  Filled 2021-11-25: qty 1

## 2021-11-25 MED ORDER — ADULT MULTIVITAMIN W/MINERALS CH
1.0000 | ORAL_TABLET | Freq: Every day | ORAL | Status: DC
  Administered 2021-11-26 – 2021-12-03 (×7): 1 via ORAL
  Filled 2021-11-25 (×7): qty 1

## 2021-11-25 MED ORDER — LORAZEPAM 2 MG/ML IJ SOLN
1.0000 mg | Freq: Once | INTRAMUSCULAR | Status: AC
  Administered 2021-11-25: 1 mg via INTRAVENOUS
  Filled 2021-11-25: qty 1

## 2021-11-25 MED ORDER — ALBUTEROL SULFATE (2.5 MG/3ML) 0.083% IN NEBU: 3.0000 mL | INHALATION_SOLUTION | RESPIRATORY_TRACT | Status: DC | PRN

## 2021-11-25 MED ORDER — ONDANSETRON HCL 4 MG/2ML IJ SOLN
4.0000 mg | INTRAMUSCULAR | Status: DC | PRN
  Administered 2021-11-26 – 2021-11-27 (×2): 4 mg via INTRAVENOUS
  Filled 2021-11-25 (×2): qty 2

## 2021-11-25 NOTE — ED Triage Notes (Signed)
Patient here with complaint of lower back pain with radiation in right leg that started after a fall on 11/19/2021. Patient denies urinary and stool incontinence. Patient went to a chiropractor shortly after the fall but has had no relief from those interventions. Patient is alert, oriented, and in no apparent distress at this time. ?

## 2021-11-25 NOTE — ED Provider Notes (Signed)
?[facility] ?Provider Note ? ? ?CSN: [id number] ?Arrival date & time: 11/25/21  1443 ? ?  ? ?History ? ?Chief Complaint  ?Patient presents with  ? Fall  ? ? ?Sheila Goodwin is a 66 y.o. female. ? ?The history is provided by the patient, the spouse and medical records. No language interpreter was used.  ?Fall ?This is a new problem. The current episode started more than 2 days ago. The problem occurs constantly. The problem has been gradually worsening. Pertinent negatives include no chest pain, no abdominal pain, no headaches and no shortness of breath. The symptoms are aggravated by walking and standing. Nothing relieves the symptoms. She has tried nothing for the symptoms. The treatment provided no relief.  ? ?  ? ?Home Medications ?Prior to Admission medications   ?Medication Sig Start Date End Date Taking? Authorizing Provider  ?albuterol (PROVENTIL HFA;VENTOLIN HFA) 108 (90 Base) MCG/ACT inhaler Inhale 2 puffs into the lungs every 4 (four) hours as needed for wheezing or shortness of breath.    [provider]  ?Calcium-Vitamin D-Vitamin K 500-500-40 MG-UNT-MCG CHEW Chew 1 Dose by mouth daily.    [provider]  ?fexofenadine (ALLEGRA) 60 MG tablet Take 60 mg by mouth 2 (two) times daily.    [provider]  ?mometasone-formoterol (DULERA) 100-5 MCG/ACT AERO Inhale 2 puffs into the lungs 2 (two) times daily.    [provider]  ?Multiple Vitamin (MULTIVITAMIN) tablet Take 1 tablet by mouth daily.    [provider]  ?OVER THE COUNTER MEDICATION Take 1 tablet by mouth daily. fiber    [provider]  ?OVER THE COUNTER MEDICATION Take 1 capsule by mouth daily. Probiotic    [provider]  ?   ? ?Allergies    ?Patient has no known allergies.   ? ?Review of Systems   ?Review of Systems  ?Constitutional:  Negative for chills, fatigue and fever.  ?HENT:  Negative for congestion.   ?Respiratory:  Negative for  cough, chest tightness, shortness of breath and wheezing.   ?Cardiovascular:  Negative for chest pain, palpitations and leg swelling.  ?Gastrointestinal:  Negative for abdominal pain, constipation, diarrhea, nausea and vomiting.  ?Genitourinary:  Negative for dysuria and flank pain.  ?Musculoskeletal:  Positive for back pain. Negative for neck pain and neck stiffness.  ?Skin:  Negative for rash and wound.  ?Neurological:  Negative for weakness, light-headedness, numbness and headaches.  ?Psychiatric/Behavioral:  Positive for agitation. Negative for confusion.   ?All other systems reviewed and are negative. ? ?Physical Exam ?Updated Vital Signs ?BP (!) 141/80 (BP Location: Right Arm)   Pulse (!) 101   Temp 98.2 ?F (36.8 ?C) (Oral)   Resp 17   SpO2 98%  ?Physical Exam ?Vitals and nursing note reviewed.  ?Constitutional:   ?   General: She is not in acute distress. ?   Appearance: She is well-developed. She is not ill-appearing, toxic-appearing or diaphoretic.  ?HENT:  ?   Head: Normocephalic and atraumatic.  ?   Nose: No congestion or rhinorrhea.  ?   Mouth/Throat:  ?   Mouth: Mucous membranes are moist.  ?Eyes:  ?   Extraocular Movements: Extraocular movements intact.  ?   Conjunctiva/sclera: Conjunctivae normal.  ?   Pupils: Pupils are equal, round, and reactive to light.  ?Cardiovascular:  ?   Rate and Rhythm: Normal rate and regular rhythm.  ?   Heart sounds: No murmur heard. ?Pulmonary:  ?  Effort: Pulmonary effort is normal. No respiratory distress.  ?   Breath sounds: Normal breath sounds. No wheezing, rhonchi or rales.  ?Chest:  ?   Chest wall: No tenderness.  ?Abdominal:  ?   General: Abdomen is flat. There is no distension.  ?   Palpations: Abdomen is soft.  ?   Tenderness: There is no abdominal tenderness.  ?Musculoskeletal:     ?   General: Tenderness present. No swelling.  ?   Cervical back: Neck supple.  ?   Right lower leg: No edema.  ?   Left lower leg: No edema.  ?Skin: ?   General: Skin is warm  and dry.  ?   Capillary Refill: Capillary refill takes less than 2 seconds.  ?Neurological:  ?   General: No focal deficit present.  ?   Mental Status: She is alert.  ?   Sensory: No sensory deficit.  ?   Motor: No weakness.  ?Psychiatric:     ?   Mood and Affect: Mood is anxious.  ? ? ?ED Results / Procedures / Treatments   ?Labs ?(all labs ordered are listed, but only abnormal results are displayed) ?Labs Reviewed  ?CBC WITH DIFFERENTIAL/PLATELET - Abnormal; Notable for the following components:  ?    Result Value  ? WBC 16.0 (*)   ? RBC 5.49 (*)   ? Hemoglobin 15.4 (*)   ? HCT 47.3 (*)   ? Platelets 613 (*)   ? Neutro Abs 11.0 (*)   ? Monocytes Absolute 1.5 (*)   ? Abs Immature Granulocytes 0.08 (*)   ? All other components within normal limits  ?COMPREHENSIVE METABOLIC PANEL - Abnormal; Notable for the following components:  ? Glucose, Bld 104 (*)   ? BUN 30 (*)   ? Creatinine, Ser 1.11 (*)   ? Total Bilirubin 1.3 (*)   ? GFR, Estimated 55 (*)   ? All other components within normal limits  ?PROTIME-INR  ? ? ?EKG ?None ? ?Radiology ?DG Chest 1 View ? ?Result Date: 11/25/2021 ?CLINICAL DATA:  Recent fall with right hip pain, initial encounter EXAM: CHEST  1 VIEW COMPARISON:  None. FINDINGS: The heart size and mediastinal contours are within normal limits. Both lungs are clear. The visualized skeletal structures are unremarkable. IMPRESSION: No active disease. Electronically Signed   By: Inez Catalina M.D.   On: 11/25/2021 19:08  ? ?DG Lumbar Spine Complete ? ?Result Date: 11/25/2021 ?CLINICAL DATA:  Pain after fall.  Low back pain, fall yesterday. EXAM: LUMBAR SPINE - COMPLETE 4+ VIEW COMPARISON:  None. FINDINGS: The bones are under mineralized. There are suspected 6 non-rib-bearing lumbar vertebra with lumbarization of S1. The lower most fully formed disc space will be labeled S1-S2. Possible fracture at the sacrococcygeal junction with cortical offset, although wider aeration of normal exist in this region. Grade  anterolisthesis of L5 on S1 with associated degenerative disc disease and facet hypertrophy. There is moderate L4-L5 and mild L3-L4 facet hypertrophy. Moderate L1-L2 degenerative disc disease. No evidence of acute lumbar fracture. Lumbar vertebral body heights are normal. Limited assessment of the posterior elements due to under mineralization. IMPRESSION: 1. Possible fracture at the sacrococcygeal junction. Recommend correlation with point tenderness. 2. Transitional lumbosacral anatomy with lumbarized S1. Close attention to spinal numbering is recommended if intervention is planned. 3. Multilevel degenerative change with grade 1 anterolisthesis of L5 on S1. 4. Bones are under mineralized. Electronically Signed   By: Keith Rake M.D.   On: 11/25/2021  17:13  ? ?DG Tibia/Fibula Right ? ?Result Date: 11/25/2021 ?CLINICAL DATA:  Recent fall with known right femoral fracture, initial encounter EXAM: RIGHT TIBIA AND FIBULA - 2 VIEW COMPARISON:  None. FINDINGS: There is no evidence of fracture or other focal bone lesions. Soft tissues are unremarkable. IMPRESSION: No acute abnormality noted. Electronically Signed   By: Inez Catalina M.D.   On: 11/25/2021 19:08  ? ?CT Lumbar Spine Wo Contrast ? ?Result Date: 11/25/2021 ?CLINICAL DATA:  Low back pain.  Fall EXAM: CT LUMBAR SPINE WITHOUT CONTRAST TECHNIQUE: Multidetector CT imaging of the lumbar spine was performed without intravenous contrast administration. Multiplanar CT image reconstructions were also generated. RADIATION DOSE REDUCTION: This exam was performed according to the departmental dose-optimization program which includes automated exposure control, adjustment of the mA and/or kV according to patient size and/or use of iterative reconstruction technique. COMPARISON:  Lumbar radiographs 11/25/2021 FINDINGS: Segmentation: Small ribs at T12 are not visualized on the prior x-ray. Therefore the lowest lumbarized vertebra is L5, not S1 as noted on the prior study.  Grade 1 anterolisthesis is at L4-5. Five lumbar vertebra. Alignment: 6 mm anterolisthesis L4-5.  Mild retrolisthesis L3-4 Vertebrae: Negative for lumbar fracture or mass lesion. Paraspinal and other soft tiss

## 2021-11-25 NOTE — Progress Notes (Signed)
Patient with hip fracture following fall ?Femoral neck fracture present which will require total hip replacement ?I will touch base with [name] trauma PA tomorrow morning to arrange for that procedure to be done this hospitalization ?

## 2021-11-25 NOTE — ED Provider Triage Note (Signed)
Emergency Medicine Provider Triage Evaluation Note ? ?Ondine Gemme , a 66 y.o. female  was evaluated in triage.  Pt complains of low back pain.  She reports that she fell on 11/19/21.  She tripped..  She reports that that in the first hour the only thing that hurt was the right hip.  She was able to walk with crutches since.  ? ? ?Physical Exam  ?BP (!) 141/80 (BP Location: Right Arm)   Pulse (!) 101   Temp 98.2 ?F (36.8 ?C) (Oral)   Resp 17   SpO2 98%  ?Gen:   Awake, no distress   ?Resp:  Normal effort  ?MSK:   Pain in BLE.  ?Other:  Normal speech ? ?Medical Decision Making  ?Medically screening exam initiated at 4:05 PM.  Appropriate orders placed.  Jaquesha Boroff Genter was informed that the remainder of the evaluation will be completed by another provider, this initial triage assessment does not replace that evaluation, and the importance of remaining in the ED until their evaluation is complete. ? ? ?  ?Lorin Glass, [place] ?11/25/21 1610 ? ?

## 2021-11-25 NOTE — ED Notes (Signed)
Pt's O2 dropped to 87%, placed on 2L , O2 returned to 96% ?

## 2021-11-26 ENCOUNTER — Inpatient Hospital Stay (HOSPITAL_COMMUNITY): Payer: Medicare Other | Admitting: Anesthesiology

## 2021-11-26 ENCOUNTER — Other Ambulatory Visit: Payer: Self-pay

## 2021-11-26 ENCOUNTER — Encounter (HOSPITAL_COMMUNITY): Payer: Self-pay | Admitting: Internal Medicine

## 2021-11-26 DIAGNOSIS — J45909 Unspecified asthma, uncomplicated: Secondary | ICD-10-CM

## 2021-11-26 DIAGNOSIS — S72001A Fracture of unspecified part of neck of right femur, initial encounter for closed fracture: Secondary | ICD-10-CM | POA: Diagnosis not present

## 2021-11-26 LAB — CBC
HCT: 40.1 % (ref 36.0–46.0)
Hemoglobin: 13.3 g/dL (ref 12.0–15.0)
MCH: 28.8 pg (ref 26.0–34.0)
MCHC: 33.2 g/dL (ref 30.0–36.0)
MCV: 86.8 fL (ref 80.0–100.0)
Platelets: 476 10*3/uL — ABNORMAL HIGH (ref 150–400)
RBC: 4.62 MIL/uL (ref 3.87–5.11)
RDW: 13.8 % (ref 11.5–15.5)
WBC: 12.7 10*3/uL — ABNORMAL HIGH (ref 4.0–10.5)
nRBC: 0 % (ref 0.0–0.2)

## 2021-11-26 LAB — BASIC METABOLIC PANEL
Anion gap: 7 (ref 5–15)
BUN: 29 mg/dL — ABNORMAL HIGH (ref 8–23)
CO2: 25 mmol/L (ref 22–32)
Calcium: 8.6 mg/dL — ABNORMAL LOW (ref 8.9–10.3)
Chloride: 109 mmol/L (ref 98–111)
Creatinine, Ser: 0.86 mg/dL (ref 0.44–1.00)
GFR, Estimated: >60 mL/min (ref >60)
Glucose, Bld: 104 mg/dL — ABNORMAL HIGH (ref 70–99)
Potassium: 3.6 mmol/L (ref 3.5–5.1)
Sodium: 141 mmol/L (ref 135–145)

## 2021-11-26 MED ORDER — IPRATROPIUM-ALBUTEROL 0.5-2.5 (3) MG/3ML IN SOLN: 3.0000 mL | Freq: Four times a day (QID) | RESPIRATORY_TRACT | Status: DC | PRN

## 2021-11-26 MED ORDER — POVIDONE-IODINE 10 % EX SWAB
2.0000 "application " | Freq: Once | CUTANEOUS | Status: AC
  Administered 2021-11-27: 2 via TOPICAL

## 2021-11-26 MED ORDER — CEFAZOLIN SODIUM-DEXTROSE 2-4 GM/100ML-% IV SOLN
2.0000 g | INTRAVENOUS | Status: AC
  Administered 2021-11-27: 2 g via INTRAVENOUS
  Filled 2021-11-26: qty 100

## 2021-11-26 MED ORDER — DEXAMETHASONE SODIUM PHOSPHATE 4 MG/ML IJ SOLN
INTRAMUSCULAR | Status: DC | PRN
  Administered 2021-11-26: 10 mg via PERINEURAL

## 2021-11-26 MED ORDER — MUPIROCIN 2 % EX OINT
1.0000 "application " | TOPICAL_OINTMENT | Freq: Two times a day (BID) | CUTANEOUS | Status: AC
  Administered 2021-11-27 – 2021-12-01 (×6): 1 via NASAL
  Filled 2021-11-26 (×2): qty 22

## 2021-11-26 MED ORDER — MIDAZOLAM HCL 2 MG/2ML IJ SOLN
INTRAMUSCULAR | Status: AC
  Filled 2021-11-26: qty 2

## 2021-11-26 MED ORDER — MIDAZOLAM HCL 2 MG/2ML IJ SOLN: 1.0000 mg | Freq: Once | INTRAMUSCULAR | Status: DC

## 2021-11-26 MED ORDER — TRANEXAMIC ACID-NACL 1000-0.7 MG/100ML-% IV SOLN
1000.0000 mg | INTRAVENOUS | Status: AC
  Administered 2021-11-27: 1000 mg via INTRAVENOUS
  Filled 2021-11-26: qty 100

## 2021-11-26 MED ORDER — BUPIVACAINE HCL (PF) 0.5 % IJ SOLN
INTRAMUSCULAR | Status: DC | PRN
  Administered 2021-11-26: 3 mL via PERINEURAL

## 2021-11-26 MED ORDER — MIDAZOLAM HCL 2 MG/2ML IJ SOLN
2.0000 mg | Freq: Once | INTRAMUSCULAR | Status: AC
  Administered 2021-11-26: 2 mg via INTRAVENOUS

## 2021-11-26 MED ORDER — FENTANYL CITRATE (PF) 100 MCG/2ML IJ SOLN
INTRAMUSCULAR | Status: AC
  Filled 2021-11-26: qty 2

## 2021-11-26 MED ORDER — CHLORHEXIDINE GLUCONATE 4 % EX LIQD
60.0000 mL | Freq: Once | CUTANEOUS | Status: AC
  Administered 2021-11-27: 4 via TOPICAL
  Filled 2021-11-26 (×2): qty 60

## 2021-11-26 MED ORDER — FENTANYL CITRATE PF 50 MCG/ML IJ SOSY
50.0000 ug | PREFILLED_SYRINGE | Freq: Once | INTRAMUSCULAR | Status: AC
Start: 2021-11-26 — End: 2021-11-26
  Administered 2021-11-26: 50 ug via INTRAVENOUS

## 2021-11-26 MED ORDER — ALBUTEROL SULFATE (2.5 MG/3ML) 0.083% IN NEBU: 3.0000 mL | INHALATION_SOLUTION | RESPIRATORY_TRACT | Status: DC | PRN

## 2021-11-26 NOTE — Anesthesia Preprocedure Evaluation (Signed)
Anesthesia Evaluation  ?Patient identified by MRN, date of birth, ID band ?Patient awake ? ? ? ?Reviewed: ?Allergy & Precautions, NPO status , Patient's Chart, lab work & pertinent test results ? ?Airway ?Mallampati: III ? ?TM Distance: >3 FB ?Neck ROM: Full ? ? ? Dental ? ?(+) Dental Advisory Given, Poor Dentition ?  ?Pulmonary ?asthma ,  ?  ?Pulmonary exam normal ?breath sounds clear to auscultation ? ? ? ? ? ? Cardiovascular ?negative cardio ROS ?Normal cardiovascular exam ?Rhythm:Regular Rate:Normal ? ? ?  ?Neuro/Psych ?negative neurological ROS ? negative psych ROS  ? GI/Hepatic ?negative GI ROS, Neg liver ROS,   ?Endo/Other  ?Morbid obesity ? Renal/GU ?negative Renal ROS  ?negative genitourinary ?  ?Musculoskeletal ?Right hip fx  ? Abdominal ?(+) + obese,   ?Peds ?negative pediatric ROS ?(+)  Hematology ?negative hematology ROS ?(+)   ?Anesthesia Other Findings ? ? Reproductive/Obstetrics ?negative OB ROS ? ?  ? ? ? ? ? ? ? ? ? ? ? ? ? ?  ?  ? ? ? ? ? ? ? ? ?Anesthesia Physical ?Anesthesia Plan ? ?ASA: 3 ? ?Anesthesia Plan: Regional  ? ?Post-op Pain Management:   ? ?Induction:  ? ?PONV Risk Score and Plan: Treatment may vary due to age or medical condition ? ?Airway Management Planned: Natural Airway ? ?Additional Equipment: None ? ?Intra-op Plan:  ? ?Post-operative Plan:  ? ?Informed Consent: I have reviewed the patients History and Physical, chart, labs and discussed the procedure including the risks, benefits and alternatives for the proposed anesthesia with the patient or authorized representative who has indicated his/her understanding and acceptance.  ? ? ? ? ? ?Plan Discussed with: CRNA ? ?Anesthesia Plan Comments: (peng nerve block for hip fx pain )  ? ? ? ? ? ? ?Anesthesia Quick Evaluation ? ?

## 2021-11-26 NOTE — Anesthesia Procedure Notes (Signed)
Anesthesia Regional Block: Peng block  ? ?Pre-Anesthetic Checklist: , timeout performed,  Correct Patient, Correct Site, Correct Laterality,  Correct Procedure, Correct Position, site marked,  Risks and benefits discussed,  Surgical consent,  Pre-op evaluation,  At surgeon's request and post-op pain management ? ?Laterality: Right ? ?Prep: Maximum Sterile Barrier Precautions used, chloraprep     ?  ?Needles:  ?Injection technique: Single-shot ? ?Needle Type: Echogenic Stimulator Needle   ? ? ?Needle Length: 9cm  ?Needle Gauge: 22  ? ? ? ?Additional Needles: ? ? ?Procedures:,,,, ultrasound used (permanent image in chart),,    ?Narrative:  ?Start time: 11/26/2021 9:30 AM ?End time: 11/26/2021 9:40 AM ?Injection made incrementally with aspirations every 5 mL. ? ?Performed by: Personally  ?Anesthesiologist: Pervis Hocking, DO ? ?Additional Notes: ?Monitors applied. No increased pain on injection. No increased resistance to injection. Injection made in 5cc increments. Good needle visualization. Patient tolerated procedure well.  ? ? ? ? ?

## 2021-11-26 NOTE — Progress Notes (Signed)
Transition of Care (TOC) - CAGE-AID Screening ? ? ?Patient Details  ?Name: Sheila Goodwin ?MRN: [id number] ?Date of Birth: Apr 08, 1956 ? ?Transition of Care (TOC) CM/SW Contact:    ?Clovis Cao, RN ?Phone Number: [phone] ?11/26/2021, 8:27 PM ? ? ?Clinical Narrative: ?Pt here after falling and sustaining a mild angulated right femoral neck fracture.  Pt denies current alcohol or drug use. ? ?CAGE-AID Screening: ?  ? ?Have You Ever Felt You Ought to Cut Down on Your Drinking or Drug Use?: No ?Have People Annoyed You By Critizing Your Drinking Or Drug Use?: No ?Have You Felt Bad Or Guilty About Your Drinking Or Drug Use?: No ?Have You Ever Had a Drink or Used Drugs First Thing In The Morning to Steady Your Nerves or to Get Rid of a Hangover?: No ?CAGE-AID Score: 0 ? ?Substance Abuse Education Offered: No ? ?  ? ? ? ? ? ? ?

## 2021-11-26 NOTE — H&P (View-Only) (Signed)
Reason for Consult:Right hip fx ?Referring Physician: Shelly Coss ?Time called: 0730 ?Time at bedside: 0845  ? ? ?Kaegan Stigler is an 66 y.o. female.  ?HPI: Eldana was working with a curtain rod last Wednesday when she got her feet tangled up in it and fell. She had immediate right hip pain but was still able to get around with some crutches. She decided to see her chiropractor who recommended x-rays but she didn't come for evaluation until yesterday. X-rays showed a femoral neck fx and orthopedic surgery was consulted. She is retired and did not use any assistive devices before this fall. She lives at home with her husband. ? ?Past Medical History:  ?Diagnosis Date  ? Asthma   ? ? ?Past Surgical History:  ?Procedure Laterality Date  ? CHOLECYSTECTOMY    ? ? ?Family History  ?Problem Relation Age of Onset  ? Stroke Mother   ? COPD Father   ? ? ?Social History:  reports that she is a non-smoker but has been exposed to tobacco smoke. She has never used smokeless tobacco. She reports that she does not currently use alcohol. She reports that she does not use drugs. ? ?Allergies: No Known Allergies ? ?Medications: I have reviewed the patient's current medications. ? ?Results for orders placed or performed during the hospital encounter of 11/25/21 (from the past 48 hour(s))  ?CBC with Differential     Status: Abnormal  ? Collection Time: 11/25/21  6:04 PM  ?Result Value Ref Range  ? WBC 16.0 (H) 4.0 - 10.5 K/uL  ? RBC 5.49 (H) 3.87 - 5.11 MIL/uL  ? Hemoglobin 15.4 (H) 12.0 - 15.0 g/dL  ? HCT 47.3 (H) 36.0 - 46.0 %  ? MCV 86.2 80.0 - 100.0 fL  ? MCH 28.1 26.0 - 34.0 pg  ? MCHC 32.6 30.0 - 36.0 g/dL  ? RDW 13.6 11.5 - 15.5 %  ? Platelets 613 (H) 150 - 400 K/uL  ? nRBC 0.0 0.0 - 0.2 %  ? Neutrophils Relative % 67 %  ? Neutro Abs 11.0 (H) 1.7 - 7.7 K/uL  ? Lymphocytes Relative 20 %  ? Lymphs Abs 3.2 0.7 - 4.0 K/uL  ? Monocytes Relative 10 %  ? Monocytes Absolute 1.5 (H) 0.1 - 1.0 K/uL  ? Eosinophils Relative 1 %  ?  Eosinophils Absolute 0.1 0.0 - 0.5 K/uL  ? Basophils Relative 1 %  ? Basophils Absolute 0.1 0.0 - 0.1 K/uL  ? Immature Granulocytes 1 %  ? Abs Immature Granulocytes 0.08 (H) 0.00 - 0.07 K/uL  ?  Comment: Performed at [facility], [place] [address], [place], [place] [address]  ?Comprehensive metabolic panel     Status: Abnormal  ? Collection Time: 11/25/21  6:04 PM  ?Result Value Ref Range  ? Sodium 140 135 - 145 mmol/L  ? Potassium 3.7 3.5 - 5.1 mmol/L  ? Chloride 104 98 - 111 mmol/L  ? CO2 22 22 - 32 mmol/L  ? Glucose, Bld 104 (H) 70 - 99 mg/dL  ?  Comment: Glucose reference range applies only to samples taken after fasting for at least 8 hours.  ? BUN 30 (H) 8 - 23 mg/dL  ? Creatinine, Ser 1.11 (H) 0.44 - 1.00 mg/dL  ? Calcium 10.1 8.9 - 10.3 mg/dL  ? Total Protein 7.8 6.5 - 8.1 g/dL  ? Albumin 4.1 3.5 - 5.0 g/dL  ? AST 33 15 - 41 U/L  ? ALT 18 0 - 44 U/L  ?  Alkaline Phosphatase 88 38 - 126 U/L  ? Total Bilirubin 1.3 (H) 0.3 - 1.2 mg/dL  ? GFR, Estimated 55 (L) >60 mL/min  ?  Comment: (NOTE) ?Calculated using the CKD-EPI Creatinine Equation (2021) ?  ? Anion gap 14 5 - 15  ?  Comment: Performed at [facility], [place] [address] [place], [place] [address]  ?Protime-INR     Status: None  ? Collection Time: 11/25/21  6:04 PM  ?Result Value Ref Range  ? Prothrombin Time 13.7 11.4 - 15.2 seconds  ? INR 1.1 0.8 - 1.2  ?  Comment: (NOTE) ?INR goal varies based on device and disease states. ?Performed at [facility], [place] [address], [place], [place] ?[address] ?  ?CBC     Status: Abnormal  ? Collection Time: 11/26/21  4:40 AM  ?Result Value Ref Range  ? WBC 12.7 (H) 4.0 - 10.5 K/uL  ? RBC 4.62 3.87 - 5.11 MIL/uL  ? Hemoglobin 13.3 12.0 - 15.0 g/dL  ? HCT 40.1 36.0 - 46.0 %  ? MCV 86.8 80.0 - 100.0 fL  ? MCH 28.8 26.0 - 34.0 pg  ? MCHC 33.2 30.0 - 36.0 g/dL  ? RDW 13.8 11.5 - 15.5 %  ? Platelets 476 (H) 150 - 400 K/uL  ? nRBC 0.0 0.0 - 0.2 %  ?  Comment: Performed at [facility], [place] [address], [place], [place] [address]  ?Basic metabolic panel     Status: Abnormal  ? Collection Time: 11/26/21  4:40 AM  ?Result Value Ref Range  ? Sodium 141 135 - 145 mmol/L  ? Potassium 3.6 3.5 - 5.1 mmol/L  ? Chloride 109 98 - 111 mmol/L  ? CO2 25 22 - 32 mmol/L  ? Glucose, Bld 104 (H) 70 - 99 mg/dL  ?  Comment: Glucose reference range applies only to samples taken after fasting for at least 8 hours.  ? BUN 29 (H) 8 - 23 mg/dL  ? Creatinine, Ser 0.86 0.44 - 1.00 mg/dL  ? Calcium 8.6 (L) 8.9 - 10.3 mg/dL  ? GFR, Estimated >60 >60 mL/min  ?  Comment: (NOTE) ?Calculated using the CKD-EPI Creatinine Equation (2021) ?  ? Anion gap 7 5 - 15  ?  Comment: Performed at [facility], [place] [address], [place], [place] [address]  ? ? ?DG Chest 1 View ? ?Result Date: 11/25/2021 ?CLINICAL DATA:  Recent fall with right hip pain, initial encounter EXAM: CHEST  1 VIEW COMPARISON:  None. FINDINGS: The heart size and mediastinal contours are within normal limits. Both lungs are clear. The visualized skeletal structures are unremarkable. IMPRESSION: No active disease. Electronically Signed   By: Inez Catalina M.D.   On: 11/25/2021 19:08  ? ?DG Lumbar Spine Complete ? ?Result Date: 11/25/2021 ?CLINICAL DATA:  Pain after fall.  Low back pain, fall yesterday. EXAM: LUMBAR SPINE - COMPLETE 4+ VIEW COMPARISON:  None. FINDINGS: The bones are under mineralized. There are suspected 6 non-rib-bearing lumbar vertebra with lumbarization of S1. The lower most fully formed disc space will be labeled S1-S2. Possible fracture at the sacrococcygeal junction with cortical offset, although wider aeration of normal exist in this region. Grade anterolisthesis of L5 on S1 with associated degenerative disc disease and facet hypertrophy. There is moderate L4-L5 and mild L3-L4 facet hypertrophy. Moderate L1-L2 degenerative disc disease. No evidence of acute lumbar fracture. Lumbar vertebral body heights are normal. Limited assessment of the posterior elements due  to under mineralization. IMPRESSION: 1. Possible fracture at the sacrococcygeal junction. Recommend correlation with point tenderness. 2. Transitional lumbosacral anatomy with lumbarized S1. Close attention to spinal numbering is recommended if intervention is planned. 3. Multilevel degenerative change with grade 1 anterolisthesis of L5 on S1. 4. Bones are under mineralized. Electronically Signed   By: Keith Rake M.D.   On: 11/25/2021 17:13  ? ?DG Tibia/Fibula Right ? ?Result Date: 11/25/2021 ?CLINICAL DATA:  Recent fall with known right femoral fracture, initial encounter EXAM: RIGHT TIBIA AND FIBULA - 2 VIEW COMPARISON:  None. FINDINGS: There is no evidence of fracture or other focal bone lesions. Soft tissues are unremarkable. IMPRESSION: No acute abnormality noted. Electronically Signed   By: Inez Catalina M.D.   On: 11/25/2021 19:08  ? ?CT Lumbar Spine Wo Contrast ? ?Result Date: 11/25/2021 ?CLINICAL DATA:  Low back pain.  Fall EXAM: CT LUMBAR SPINE WITHOUT CONTRAST TECHNIQUE: Multidetector CT imaging of the lumbar spine was performed without intravenous contrast administration. Multiplanar CT image reconstructions were also generated. RADIATION DOSE REDUCTION: This exam was performed according to the departmental dose-optimization program which includes automated exposure control, adjustment of the mA and/or kV according to patient size and/or use of iterative reconstruction technique. COMPARISON:  Lumbar radiographs 11/25/2021 FINDINGS: Segmentation: Small ribs at T12 are not visualized on the prior x-ray. Therefore the lowest lumbarized vertebra is L5, not S1 as noted on the prior study. Grade 1 anterolisthesis is at L4-5. Five lumbar vertebra. Alignment: 6 mm anterolisthesis L4-5.  Mild retrolisthesis L3-4 Vertebrae: Negative for lumbar fracture or mass lesion. Paraspinal and other soft tissues: 4 cm right upper pole renal cyst. Negative for paraspinous mass or adenopathy. Sigmoid diverticulosis. Disc  levels: T12-L1: Mild disc degeneration and disc bulging. Negative for stenosis L1-2: Negative L2-3: Mild disc bulging and mild facet degeneration. Negative for stenosis L3-4: Mild disc bulging and moderate f

## 2021-11-26 NOTE — ED Notes (Signed)
Back from PACU, returned to monitor, VSS, breakfast given.  ?

## 2021-11-26 NOTE — ED Notes (Signed)
Updated pt and husband at [facility] ?

## 2021-11-26 NOTE — Assessment & Plan Note (Signed)
-   We will place on as needed DuoNebs. ?- She has no current exacerbation. ?

## 2021-11-26 NOTE — ED Notes (Signed)
PACU here for pt, for nerve block, will return to ED 39. Snack given.  ?

## 2021-11-26 NOTE — Anesthesia Postprocedure Evaluation (Signed)
Anesthesia Post Note ? ?Patient: Sheila Goodwin ? ?Procedure(s) Performed: AN AD HOC NERVE BLOCK ? ?  ? ?Patient location during evaluation: PACU ?Anesthesia Type: Regional ?Level of consciousness: awake and alert ?Pain management: pain level controlled ?Vital Signs Assessment: post-procedure vital signs reviewed and stable ?Respiratory status: spontaneous breathing, nonlabored ventilation and respiratory function stable ?Cardiovascular status: blood pressure returned to baseline and stable ?Postop Assessment: no apparent nausea or vomiting ?Anesthetic complications: no ? ? ?No notable events documented. ? ?Last Vitals:  ?Vitals:  ? 11/26/21 0938 11/26/21 0945  ?BP: 117/63 96/62  ?Pulse: 90 88  ?Resp: 14 16  ?Temp:    ?SpO2: 94% 93%  ?  ?Last Pain:  ?Vitals:  ? 11/26/21 0900  ?TempSrc:   ?PainSc: 3   ? ? ?  ?  ?  ?  ?  ?  ? ?[name] ? ? ? ? ?

## 2021-11-26 NOTE — ED Notes (Signed)
Meal ordered

## 2021-11-26 NOTE — H&P (Addendum)
?  ?  ?[place] ? ? ?PATIENT NAME: Sheila Goodwin   ? ?MR#:  [id number] ? ?DATE OF BIRTH:  1956/01/25 ? ?DATE OF ADMISSION:  11/25/2021 ? ?PRIMARY CARE PHYSICIAN: London Pepper, MD  ? ?Patient is coming from: Home ? ?REQUESTING/REFERRING PHYSICIAN: Tegeler, Gwenyth Allegra, MD  ? ?CHIEF COMPLAINT:  ? ?Chief Complaint  ?Patient presents with  ?? Fall  ? ? ?HISTORY OF PRESENT ILLNESS:  ?Sheila Goodwin is a 66 y.o. female with medical history significant for asthma, who presented to the emergency room with acute onset of accidental mechanical fall on 11/19/2021 while hanging her curtain after which she has been having low back pain as well as right hip pain.  She did not have any reported presyncope or syncope.  No reported chest pain or palpitations.  No nausea or vomiting or abdominal pain.  No fever or chills.  No dysuria, oliguria or hematuria or flank pain.  She started having worsening right lower extremity pain over the last couple of days. ?ED Course: When she came to the ER BP was 141/80 with a heart rate of 101 with otherwise normal vital signs.  Labs revealed Leukocytosis of 16 with neutrophilia and BUN of 30 with a creatinine of 1.11 and total bili 1.3.  Previous creatinine was 0.96 in 2019. ? ?Imaging: Portable chest ray showed no acute cardiopulmonary disease 2 view femur x-ray showed mild angulated right femoral neck fracture right tibia and fibula x-ray came back negative for fracture CT of the lumbar spine was negative for fracture.  It showed lumbosacral junction L5-S1 with grade 1 anterolisthesis pelvic CT scan revealed acute displaced and mildly angulated right femoral neck fracture with surrounding soft tissue swelling and sigmoid diverticulosis without evidence of colitis.  And 6 mm anterolisthesis at L4-L5 with mild spinal stenosis, ? ?The patient was given 50 mcg IV fentanyl and 1 mg of IV Ativan.  She will be admitted to a surgical telemetry bed evaluation and management ? ?PAST MEDICAL  HISTORY:  ? ?Past Medical History:  ?Diagnosis Date  ?? Asthma   ? ? ?PAST SURGICAL HISTORY:  ? ?Past Surgical History:  ?Procedure Laterality Date  ?? CHOLECYSTECTOMY    ? ? ?SOCIAL HISTORY:  ? ?Social History  ? ?Tobacco Use  ?? Smoking status: Passive Smoke Exposure - Never Smoker  ?? Smokeless tobacco: Never  ?Substance Use Topics  ?? Alcohol use: Not Currently  ? ? ?FAMILY HISTORY:  ? ?Family History  ?Problem Relation Age of Onset  ?? Stroke Mother   ?? COPD Father   ? ? ?DRUG ALLERGIES:  ?No Known Allergies ? ?REVIEW OF SYSTEMS:  ? ?ROS ?As per history of present illness. All pertinent systems were reviewed above. Constitutional, HEENT, cardiovascular, respiratory, GI, GU, musculoskeletal, neuro, psychiatric, endocrine, integumentary and hematologic systems were reviewed and are otherwise negative/unremarkable except for positive findings mentioned above in the HPI. ? ? ?MEDICATIONS AT HOME:  ? ?Prior to Admission medications   ?Medication Sig Start Date End Date Taking? Authorizing Provider  ?albuterol (PROVENTIL HFA;VENTOLIN HFA) 108 (90 Base) MCG/ACT inhaler Inhale 2 puffs into the lungs every 4 (four) hours as needed for wheezing or shortness of breath.    [provider]  ?Calcium-Vitamin D-Vitamin K 500-500-40 MG-UNT-MCG CHEW Chew 1 Dose by mouth daily.    [provider]  ?fexofenadine (ALLEGRA) 60 MG tablet Take 60 mg by mouth 2 (two) times daily.    [provider]  ?mometasone-formoterol (DULERA) 100-5 MCG/ACT AERO Inhale  2 puffs into the lungs 2 (two) times daily.    [provider]  ?Multiple Vitamin (MULTIVITAMIN) tablet Take 1 tablet by mouth daily.    [provider]  ?OVER THE COUNTER MEDICATION Take 1 tablet by mouth daily. fiber    [provider]  ?OVER THE COUNTER MEDICATION Take 1 capsule by mouth daily. Probiotic    [provider]  ? ?  ? ?VITAL SIGNS:  ?Blood pressure 103/60, pulse 88, temperature 98.2 ?F (36.8 ?C),  temperature source Oral, resp. rate (!) 24, SpO2 91 %. ? ?PHYSICAL EXAMINATION:  ?Physical Exam ? ?GENERAL:  66 y.o.-year-old female patient lying in the bed with no acute distress.  ?EYES: Pupils equal, round, reactive to light and accommodation. No scleral icterus. Extraocular muscles intact.  ?HEENT: Head atraumatic, normocephalic. Oropharynx and nasopharynx clear.  ?NECK:  Supple, no jugular venous distention. No thyroid enlargement, no tenderness.  ?LUNGS: Normal breath sounds bilaterally, no wheezing, rales,rhonchi or crepitation. No use of accessory muscles of respiration.  ?CARDIOVASCULAR: Regular rate and rhythm, S1, S2 normal. No murmurs, rubs, or gallops.  ?ABDOMEN: Soft, nondistended, nontender. Bowel sounds present. No organomegaly or mass.  ?EXTREMITIES: No pedal edema, cyanosis, or clubbing. ?Musculoskeletal: Right lateral hip tenderness. ?NEUROLOGIC: Cranial nerves II through XII are intact. Muscle strength 5/5 in all extremities. Sensation intact. Gait not checked.  ?PSYCHIATRIC: The patient is alert and oriented x 3.  Normal affect and good eye contact. ?SKIN: No obvious rash, lesion, or ulcer.  ? ?LABORATORY PANEL:  ? ?CBC ?Recent Labs  ?Lab 11/25/21 ?[id number]  ?WBC 16.0*  ?HGB 15.4*  ?HCT 47.3*  ?PLT 613*  ? ?------------------------------------------------------------------------------------------------------------------ ? ?Chemistries  ?Recent Labs  ?Lab 11/25/21 ?[id number]  ?NA 140  ?K 3.7  ?CL 104  ?CO2 22  ?GLUCOSE 104*  ?BUN 30*  ?CREATININE 1.11*  ?CALCIUM 10.1  ?AST 33  ?ALT 18  ?ALKPHOS 88  ?BILITOT 1.3*  ? ?------------------------------------------------------------------------------------------------------------------ ? ?Cardiac Enzymes ?No results for input(s): TROPONINI in the last 168 hours. ?------------------------------------------------------------------------------------------------------------------ ? ?RADIOLOGY:  ?DG Chest 1 View ? ?Result Date: 11/25/2021 ?CLINICAL DATA:  Recent fall  with right hip pain, initial encounter EXAM: CHEST  1 VIEW COMPARISON:  None. FINDINGS: The heart size and mediastinal contours are within normal limits. Both lungs are clear. The visualized skeletal structures are unremarkable. IMPRESSION: No active disease. Electronically Signed   By: Inez Catalina M.D.   On: 11/25/2021 19:08  ? ?DG Lumbar Spine Complete ? ?Result Date: 11/25/2021 ?CLINICAL DATA:  Pain after fall.  Low back pain, fall yesterday. EXAM: LUMBAR SPINE - COMPLETE 4+ VIEW COMPARISON:  None. FINDINGS: The bones are under mineralized. There are suspected 6 non-rib-bearing lumbar vertebra with lumbarization of S1. The lower most fully formed disc space will be labeled S1-S2. Possible fracture at the sacrococcygeal junction with cortical offset, although wider aeration of normal exist in this region. Grade anterolisthesis of L5 on S1 with associated degenerative disc disease and facet hypertrophy. There is moderate L4-L5 and mild L3-L4 facet hypertrophy. Moderate L1-L2 degenerative disc disease. No evidence of acute lumbar fracture. Lumbar vertebral body heights are normal. Limited assessment of the posterior elements due to under mineralization. IMPRESSION: 1. Possible fracture at the sacrococcygeal junction. Recommend correlation with point tenderness. 2. Transitional lumbosacral anatomy with lumbarized S1. Close attention to spinal numbering is recommended if intervention is planned. 3. Multilevel degenerative change with grade 1 anterolisthesis of L5 on S1. 4. Bones are under mineralized. Electronically Signed   By: Keith Rake  M.D.   On: 11/25/2021 17:13  ? ?DG Tibia/Fibula Right ? ?Result Date: 11/25/2021 ?CLINICAL DATA:  Recent fall with known right femoral fracture, initial encounter EXAM: RIGHT TIBIA AND FIBULA - 2 VIEW COMPARISON:  None. FINDINGS: There is no evidence of fracture or other focal bone lesions. Soft tissues are unremarkable. IMPRESSION: No acute abnormality noted. Electronically  Signed   By: Inez Catalina M.D.   On: 11/25/2021 19:08  ? ?CT Lumbar Spine Wo Contrast ? ?Result Date: 11/25/2021 ?CLINICAL DATA:  Low back pain.  Fall EXAM: CT LUMBAR SPINE WITHOUT CONTRAST TECHNIQUE: Multidetector

## 2021-11-26 NOTE — Assessment & Plan Note (Addendum)
-   This is secondary to mechanical fall. ?- The patient will be admitted to a surgical telemetry bed. ?- Pain management will be provided. ?- Orthopedic consultation will be obtained. ?- Dr. Marlou Sa was notified about the patient and is aware.  She will likely need a right THA. ?- She has no history of CHF, CAD, CVA, diabetes mellitus on insulin or renal failure with creatinine more than 2.  She is considered at average risk for her age per the revised cardiac risk index.  She has asthma with no current active pulmonary issues. ?- We will keep her n.p.o. after midnight. ?

## 2021-11-26 NOTE — Consult Note (Signed)
Reason for Consult:Right hip fx ?Referring Physician: Shelly Coss ?Time called: 0730 ?Time at bedside: 0845  ? ? ?Sheila Goodwin is an 66 y.o. female.  ?HPI: Sheila Goodwin was working with a curtain rod last Wednesday when she got her feet tangled up in it and fell. She had immediate right hip pain but was still able to get around with some crutches. She decided to see her chiropractor who recommended x-rays but she didn't come for evaluation until yesterday. X-rays showed a femoral neck fx and orthopedic surgery was consulted. She is retired and did not use any assistive devices before this fall. She lives at home with her husband. ? ?Past Medical History:  ?Diagnosis Date  ? Asthma   ? ? ?Past Surgical History:  ?Procedure Laterality Date  ? CHOLECYSTECTOMY    ? ? ?Family History  ?Problem Relation Age of Onset  ? Stroke Mother   ? COPD Father   ? ? ?Social History:  reports that she is a non-smoker but has been exposed to tobacco smoke. She has never used smokeless tobacco. She reports that she does not currently use alcohol. She reports that she does not use drugs. ? ?Allergies: No Known Allergies ? ?Medications: I have reviewed the patient's current medications. ? ?Results for orders placed or performed during the hospital encounter of 11/25/21 (from the past 48 hour(s))  ?CBC with Differential     Status: Abnormal  ? Collection Time: 11/25/21  6:04 PM  ?Result Value Ref Range  ? WBC 16.0 (H) 4.0 - 10.5 K/uL  ? RBC 5.49 (H) 3.87 - 5.11 MIL/uL  ? Hemoglobin 15.4 (H) 12.0 - 15.0 g/dL  ? HCT 47.3 (H) 36.0 - 46.0 %  ? MCV 86.2 80.0 - 100.0 fL  ? MCH 28.1 26.0 - 34.0 pg  ? MCHC 32.6 30.0 - 36.0 g/dL  ? RDW 13.6 11.5 - 15.5 %  ? Platelets 613 (H) 150 - 400 K/uL  ? nRBC 0.0 0.0 - 0.2 %  ? Neutrophils Relative % 67 %  ? Neutro Abs 11.0 (H) 1.7 - 7.7 K/uL  ? Lymphocytes Relative 20 %  ? Lymphs Abs 3.2 0.7 - 4.0 K/uL  ? Monocytes Relative 10 %  ? Monocytes Absolute 1.5 (H) 0.1 - 1.0 K/uL  ? Eosinophils Relative 1 %  ?  Eosinophils Absolute 0.1 0.0 - 0.5 K/uL  ? Basophils Relative 1 %  ? Basophils Absolute 0.1 0.0 - 0.1 K/uL  ? Immature Granulocytes 1 %  ? Abs Immature Granulocytes 0.08 (H) 0.00 - 0.07 K/uL  ?  Comment: Performed at [facility], [place] [address] [place], [place] [address]  ?Comprehensive metabolic panel     Status: Abnormal  ? Collection Time: 11/25/21  6:04 PM  ?Result Value Ref Range  ? Sodium 140 135 - 145 mmol/L  ? Potassium 3.7 3.5 - 5.1 mmol/L  ? Chloride 104 98 - 111 mmol/L  ? CO2 22 22 - 32 mmol/L  ? Glucose, Bld 104 (H) 70 - 99 mg/dL  ?  Comment: Glucose reference range applies only to samples taken after fasting for at least 8 hours.  ? BUN 30 (H) 8 - 23 mg/dL  ? Creatinine, Ser 1.11 (H) 0.44 - 1.00 mg/dL  ? Calcium 10.1 8.9 - 10.3 mg/dL  ? Total Protein 7.8 6.5 - 8.1 g/dL  ? Albumin 4.1 3.5 - 5.0 g/dL  ? AST 33 15 - 41 U/L  ? ALT 18 0 - 44 U/L  ?  Alkaline Phosphatase 88 38 - 126 U/L  ? Total Bilirubin 1.3 (H) 0.3 - 1.2 mg/dL  ? GFR, Estimated 55 (L) >60 mL/min  ?  Comment: (NOTE) ?Calculated using the CKD-EPI Creatinine Equation (2021) ?  ? Anion gap 14 5 - 15  ?  Comment: Performed at [facility], [place] [address] [place], [place] [address]  ?Protime-INR     Status: None  ? Collection Time: 11/25/21  6:04 PM  ?Result Value Ref Range  ? Prothrombin Time 13.7 11.4 - 15.2 seconds  ? INR 1.1 0.8 - 1.2  ?  Comment: (NOTE) ?INR goal varies based on device and disease states. ?Performed at [facility], [place] [address] [place], [place] ?[address] ?  ?CBC     Status: Abnormal  ? Collection Time: 11/26/21  4:40 AM  ?Result Value Ref Range  ? WBC 12.7 (H) 4.0 - 10.5 K/uL  ? RBC 4.62 3.87 - 5.11 MIL/uL  ? Hemoglobin 13.3 12.0 - 15.0 g/dL  ? HCT 40.1 36.0 - 46.0 %  ? MCV 86.8 80.0 - 100.0 fL  ? MCH 28.8 26.0 - 34.0 pg  ? MCHC 33.2 30.0 - 36.0 g/dL  ? RDW 13.8 11.5 - 15.5 %  ? Platelets 476 (H) 150 - 400 K/uL  ? nRBC 0.0 0.0 - 0.2 %  ?  Comment: Performed at [facility], [place] [address], [place], [place] [address]  ?Basic metabolic panel     Status: Abnormal  ? Collection Time: 11/26/21  4:40 AM  ?Result Value Ref Range  ? Sodium 141 135 - 145 mmol/L  ? Potassium 3.6 3.5 - 5.1 mmol/L  ? Chloride 109 98 - 111 mmol/L  ? CO2 25 22 - 32 mmol/L  ? Glucose, Bld 104 (H) 70 - 99 mg/dL  ?  Comment: Glucose reference range applies only to samples taken after fasting for at least 8 hours.  ? BUN 29 (H) 8 - 23 mg/dL  ? Creatinine, Ser 0.86 0.44 - 1.00 mg/dL  ? Calcium 8.6 (L) 8.9 - 10.3 mg/dL  ? GFR, Estimated >60 >60 mL/min  ?  Comment: (NOTE) ?Calculated using the CKD-EPI Creatinine Equation (2021) ?  ? Anion gap 7 5 - 15  ?  Comment: Performed at [facility], [place] [address] [place], [place] [address]  ? ? ?DG Chest 1 View ? ?Result Date: 11/25/2021 ?CLINICAL DATA:  Recent fall with right hip pain, initial encounter EXAM: CHEST  1 VIEW COMPARISON:  None. FINDINGS: The heart size and mediastinal contours are within normal limits. Both lungs are clear. The visualized skeletal structures are unremarkable. IMPRESSION: No active disease. Electronically Signed   By: Inez Catalina M.D.   On: 11/25/2021 19:08  ? ?DG Lumbar Spine Complete ? ?Result Date: 11/25/2021 ?CLINICAL DATA:  Pain after fall.  Low back pain, fall yesterday. EXAM: LUMBAR SPINE - COMPLETE 4+ VIEW COMPARISON:  None. FINDINGS: The bones are under mineralized. There are suspected 6 non-rib-bearing lumbar vertebra with lumbarization of S1. The lower most fully formed disc space will be labeled S1-S2. Possible fracture at the sacrococcygeal junction with cortical offset, although wider aeration of normal exist in this region. Grade anterolisthesis of L5 on S1 with associated degenerative disc disease and facet hypertrophy. There is moderate L4-L5 and mild L3-L4 facet hypertrophy. Moderate L1-L2 degenerative disc disease. No evidence of acute lumbar fracture. Lumbar vertebral body heights are normal. Limited assessment of the posterior elements due  to under mineralization. IMPRESSION: 1. Possible fracture at the sacrococcygeal junction. Recommend correlation with point tenderness. 2. Transitional lumbosacral anatomy with lumbarized S1. Close attention to spinal numbering is recommended if intervention is planned. 3. Multilevel degenerative change with grade 1 anterolisthesis of L5 on S1. 4. Bones are under mineralized. Electronically Signed   By: Keith Rake M.D.   On: 11/25/2021 17:13  ? ?DG Tibia/Fibula Right ? ?Result Date: 11/25/2021 ?CLINICAL DATA:  Recent fall with known right femoral fracture, initial encounter EXAM: RIGHT TIBIA AND FIBULA - 2 VIEW COMPARISON:  None. FINDINGS: There is no evidence of fracture or other focal bone lesions. Soft tissues are unremarkable. IMPRESSION: No acute abnormality noted. Electronically Signed   By: Inez Catalina M.D.   On: 11/25/2021 19:08  ? ?CT Lumbar Spine Wo Contrast ? ?Result Date: 11/25/2021 ?CLINICAL DATA:  Low back pain.  Fall EXAM: CT LUMBAR SPINE WITHOUT CONTRAST TECHNIQUE: Multidetector CT imaging of the lumbar spine was performed without intravenous contrast administration. Multiplanar CT image reconstructions were also generated. RADIATION DOSE REDUCTION: This exam was performed according to the departmental dose-optimization program which includes automated exposure control, adjustment of the mA and/or kV according to patient size and/or use of iterative reconstruction technique. COMPARISON:  Lumbar radiographs 11/25/2021 FINDINGS: Segmentation: Small ribs at T12 are not visualized on the prior x-ray. Therefore the lowest lumbarized vertebra is L5, not S1 as noted on the prior study. Grade 1 anterolisthesis is at L4-5. Five lumbar vertebra. Alignment: 6 mm anterolisthesis L4-5.  Mild retrolisthesis L3-4 Vertebrae: Negative for lumbar fracture or mass lesion. Paraspinal and other soft tissues: 4 cm right upper pole renal cyst. Negative for paraspinous mass or adenopathy. Sigmoid diverticulosis. Disc  levels: T12-L1: Mild disc degeneration and disc bulging. Negative for stenosis L1-2: Negative L2-3: Mild disc bulging and mild facet degeneration. Negative for stenosis L3-4: Mild disc bulging and moderate f

## 2021-11-26 NOTE — Progress Notes (Signed)
?PROGRESS NOTE ? ?Sheila Goodwin  OFH:[id number] DOB: 1956-08-13 DOA: 11/25/2021 ?PCP: London Pepper, MD  ? ?Brief Narrative: ?Patient is a 66 year old female with history of asthma who presented after accidental mechanical fall at home while she was working with curtain rod.  After the fall, significant low back pain, right hip pain.  No history of head injury.  On presentation she was hemodynamically stable.  Imaging showed displaced right femoral neck fracture, possible fracture of sacrococcygeal joint.  Orthopedics consulted.  Plan for ORIF ? ?Assessment & Plan: ? ?Active Problems: ?  Closed right hip fracture ([place]) ?  Asthma, chronic ? ? ?Right femoral neck fracture: Presented with mechanical fall from home.  Complained of low back pain, right hip pain. ?Imaging showed displaced right femoral neck fracture.  Orthopedics consulted.  Planning for ORIF ? ?Back pain: Imagings also showed possible fracture of the sacrococcygeal joint.  Continue supportive care, pain management. ? ?History of asthma: Continue bronchodilators as needed.  Currently not in exacerbation. ? ?Leukocytosis: Most likely reactive, improving ? ? ?  ?  ?  ? ?DVT prophylaxis:SCDs Start: 11/25/21 2141 ? ? ?  Code Status: Full Code ? ?Family Communication: Husband at bedside ? ?Patient status: Inpatient ? ?Patient is from : Home ? ?Anticipated discharge to: Skilled nursing facility ? ?Estimated DC date: 2 to 3 days ? ? ?Consultants: Orthopedics ? ?Procedures: Plan for ORIF ? ?Antimicrobials:  ?Anti-infectives (From admission, onward)  ? ? None  ? ?  ? ? ?Subjective: ?Patient seen and examined at the bedside this morning.  Hemodynamically stable.  Pain well controlled.  Husband at the bedside. ? ?Objective: ?Vitals:  ? 11/26/21 0145 11/26/21 0200 11/26/21 0230 11/26/21 5498  ?BP:  121/63 103/60 133/77  ?Pulse: 85 85 88 89  ?Resp: 19 (!) 27 (!) 24 15  ?Temp:      ?TempSrc:      ?SpO2: 92% 94% 91% 98%  ? ?No intake or output data in the 24 hours  ending 11/26/21 0824 ?There were no vitals filed for this visit. ? ?Examination: ? ?General exam: Overall comfortable, not in distress,obese ?HEENT: PERRL ?Respiratory system:  no wheezes or crackles  ?Cardiovascular system: S1 & S2 heard, RRR.  ?Gastrointestinal system: Abdomen is nondistended, soft and nontender. ?Central nervous system: Alert and oriented ?Extremities: No edema, no clubbing ,no cyanosis, tenderness on the right hip, decreased range of motion of the right lower extremity ?Skin: No rashes, no ulcers,no icterus   ? ? ?Data Reviewed: I have personally reviewed following labs and imaging studies ? ?CBC: ?Recent Labs  ?Lab 11/25/21 ?2641 11/26/21 ?0440  ?WBC 16.0* 12.7*  ?NEUTROABS 11.0*  --   ?HGB 15.4* 13.3  ?HCT 47.3* 40.1  ?MCV 86.2 86.8  ?PLT 613* 476*  ? ?Basic Metabolic Panel: ?Recent Labs  ?Lab 11/25/21 ?5830 11/26/21 ?0440  ?NA 140 141  ?K 3.7 3.6  ?CL 104 109  ?CO2 22 25  ?GLUCOSE 104* 104*  ?BUN 30* 29*  ?CREATININE 1.11* 0.86  ?CALCIUM 10.1 8.6*  ? ? ? ?No results found for this or any previous visit (from the past 240 hour(s)).  ? ?Radiology Studies: ?DG Chest 1 View ? ?Result Date: 11/25/2021 ?CLINICAL DATA:  Recent fall with right hip pain, initial encounter EXAM: CHEST  1 VIEW COMPARISON:  None. FINDINGS: The heart size and mediastinal contours are within normal limits. Both lungs are clear. The visualized skeletal structures are unremarkable. IMPRESSION: No active disease. Electronically Signed   By: Inez Catalina  M.D.   On: 11/25/2021 19:08  ? ?DG Lumbar Spine Complete ? ?Result Date: 11/25/2021 ?CLINICAL DATA:  Pain after fall.  Low back pain, fall yesterday. EXAM: LUMBAR SPINE - COMPLETE 4+ VIEW COMPARISON:  None. FINDINGS: The bones are under mineralized. There are suspected 6 non-rib-bearing lumbar vertebra with lumbarization of S1. The lower most fully formed disc space will be labeled S1-S2. Possible fracture at the sacrococcygeal junction with cortical offset, although wider  aeration of normal exist in this region. Grade anterolisthesis of L5 on S1 with associated degenerative disc disease and facet hypertrophy. There is moderate L4-L5 and mild L3-L4 facet hypertrophy. Moderate L1-L2 degenerative disc disease. No evidence of acute lumbar fracture. Lumbar vertebral body heights are normal. Limited assessment of the posterior elements due to under mineralization. IMPRESSION: 1. Possible fracture at the sacrococcygeal junction. Recommend correlation with point tenderness. 2. Transitional lumbosacral anatomy with lumbarized S1. Close attention to spinal numbering is recommended if intervention is planned. 3. Multilevel degenerative change with grade 1 anterolisthesis of L5 on S1. 4. Bones are under mineralized. Electronically Signed   By: Keith Rake M.D.   On: 11/25/2021 17:13  ? ?DG Tibia/Fibula Right ? ?Result Date: 11/25/2021 ?CLINICAL DATA:  Recent fall with known right femoral fracture, initial encounter EXAM: RIGHT TIBIA AND FIBULA - 2 VIEW COMPARISON:  None. FINDINGS: There is no evidence of fracture or other focal bone lesions. Soft tissues are unremarkable. IMPRESSION: No acute abnormality noted. Electronically Signed   By: Inez Catalina M.D.   On: 11/25/2021 19:08  ? ?CT Lumbar Spine Wo Contrast ? ?Result Date: 11/25/2021 ?CLINICAL DATA:  Low back pain.  Fall EXAM: CT LUMBAR SPINE WITHOUT CONTRAST TECHNIQUE: Multidetector CT imaging of the lumbar spine was performed without intravenous contrast administration. Multiplanar CT image reconstructions were also generated. RADIATION DOSE REDUCTION: This exam was performed according to the departmental dose-optimization program which includes automated exposure control, adjustment of the mA and/or kV according to patient size and/or use of iterative reconstruction technique. COMPARISON:  Lumbar radiographs 11/25/2021 FINDINGS: Segmentation: Small ribs at T12 are not visualized on the prior x-ray. Therefore the lowest lumbarized  vertebra is L5, not S1 as noted on the prior study. Grade 1 anterolisthesis is at L4-5. Five lumbar vertebra. Alignment: 6 mm anterolisthesis L4-5.  Mild retrolisthesis L3-4 Vertebrae: Negative for lumbar fracture or mass lesion. Paraspinal and other soft tissues: 4 cm right upper pole renal cyst. Negative for paraspinous mass or adenopathy. Sigmoid diverticulosis. Disc levels: T12-L1: Mild disc degeneration and disc bulging. Negative for stenosis L1-2: Negative L2-3: Mild disc bulging and mild facet degeneration. Negative for stenosis L3-4: Mild disc bulging and moderate facet degeneration. Mild spinal stenosis. Mild subarticular stenosis bilaterally. L4-5: 6 mm anterolisthesis. Severe facet degeneration bilaterally. Moderate spinal stenosis. Negative for spinal or foraminal stenosis L5-S1: Mild facet degeneration.  Negative for stenosis. IMPRESSION: Negative for lumbar fracture Five lumbar vertebra. Lumbosacral junction is L5-S1. Grade 1 anterolisthesis L4-5 6 mm anterolisthesis L4-5 with moderate spinal stenosis. Electronically Signed   By: Franchot Gallo M.D.   On: 11/25/2021 18:46  ? ?CT PELVIS WO CONTRAST ? ?Result Date: 11/25/2021 ?CLINICAL DATA:  Fall with right leg pain EXAM: CT PELVIS WITHOUT CONTRAST TECHNIQUE: Multidetector CT imaging of the pelvis was performed following the standard protocol without intravenous contrast. RADIATION DOSE REDUCTION: This exam was performed according to the departmental dose-optimization program which includes automated exposure control, adjustment of the mA and/or kV according to patient size and/or use of iterative reconstruction technique.  COMPARISON:  Radiograph 11/25/2021 FINDINGS: Urinary Tract:  No abnormality visualized. Bowel:  Sigmoid colon diverticula without acute wall thickening. Vascular/Lymphatic: Mild aortic atherosclerosis. No aneurysm. No suspicious lymph nodes Reproductive:  Uterus and adnexa are unremarkable Other:  Negative for pelvic effusion or free  pelvic air Musculoskeletal: Acute right femoral neck fracture with cranial displacement of the trochanter by about 1/2 shaft diameter. Apex anterior angulation at the fracture site. No femoral head dislocation. So

## 2021-11-26 NOTE — ED Notes (Signed)
Ortho PA at [facility]. Surgery for tomorrow. May eat. Husband aware, leaving at this time.  ?

## 2021-11-27 ENCOUNTER — Encounter (HOSPITAL_COMMUNITY)
Admission: EM | Disposition: A | Discharge status: Skilled Nursing Facility | Payer: Self-pay | Source: Home / Self Care | Attending: Internal Medicine

## 2021-11-27 ENCOUNTER — Inpatient Hospital Stay (HOSPITAL_COMMUNITY): Payer: Medicare Other | Admitting: Certified Registered Nurse Anesthetist

## 2021-11-27 ENCOUNTER — Inpatient Hospital Stay (HOSPITAL_COMMUNITY): Payer: Medicare Other

## 2021-11-27 ENCOUNTER — Encounter (HOSPITAL_COMMUNITY): Payer: Self-pay | Admitting: Internal Medicine

## 2021-11-27 ENCOUNTER — Other Ambulatory Visit: Payer: Self-pay

## 2021-11-27 DIAGNOSIS — S72001A Fracture of unspecified part of neck of right femur, initial encounter for closed fracture: Secondary | ICD-10-CM

## 2021-11-27 HISTORY — PX: TOTAL HIP ARTHROPLASTY: SHX124

## 2021-11-27 LAB — CBC WITH DIFFERENTIAL/PLATELET
Abs Immature Granulocytes: 0.08 10*3/uL — ABNORMAL HIGH (ref 0.00–0.07)
Basophils Absolute: 0 10*3/uL (ref 0.0–0.1)
Basophils Relative: 0 %
Eosinophils Absolute: 0 10*3/uL (ref 0.0–0.5)
Eosinophils Relative: 0 %
HCT: 39.3 % (ref 36.0–46.0)
Hemoglobin: 13.1 g/dL (ref 12.0–15.0)
Immature Granulocytes: 1 %
Lymphocytes Relative: 12 %
Lymphs Abs: 1.4 10*3/uL (ref 0.7–4.0)
MCH: 28.5 pg (ref 26.0–34.0)
MCHC: 33.3 g/dL (ref 30.0–36.0)
MCV: 85.6 fL (ref 80.0–100.0)
Monocytes Absolute: 0.9 10*3/uL (ref 0.1–1.0)
Monocytes Relative: 7 %
Neutro Abs: 9.6 10*3/uL — ABNORMAL HIGH (ref 1.7–7.7)
Neutrophils Relative %: 80 %
Platelets: 512 10*3/uL — ABNORMAL HIGH (ref 150–400)
RBC: 4.59 MIL/uL (ref 3.87–5.11)
RDW: 13.6 % (ref 11.5–15.5)
WBC: 12 10*3/uL — ABNORMAL HIGH (ref 4.0–10.5)
nRBC: 0 % (ref 0.0–0.2)

## 2021-11-27 LAB — SURGICAL PCR SCREEN
MRSA, PCR: NEGATIVE
Staphylococcus aureus: NEGATIVE

## 2021-11-27 SURGERY — ARTHROPLASTY, HIP, TOTAL,POSTERIOR APPROACH
Anesthesia: General | Site: Hip | Laterality: Right

## 2021-11-27 MED ORDER — HYDROMORPHONE HCL 1 MG/ML IJ SOLN
INTRAMUSCULAR | Status: DC | PRN
  Administered 2021-11-27: .5 mg via INTRAVENOUS

## 2021-11-27 MED ORDER — 0.9 % SODIUM CHLORIDE (POUR BTL) OPTIME
TOPICAL | Status: DC | PRN
  Administered 2021-11-27: 1000 mL

## 2021-11-27 MED ORDER — FENTANYL CITRATE (PF) 250 MCG/5ML IJ SOLN
INTRAMUSCULAR | Status: DC | PRN
  Administered 2021-11-27 (×5): 50 ug via INTRAVENOUS

## 2021-11-27 MED ORDER — ACETAMINOPHEN 10 MG/ML IV SOLN: 1000.0000 mg | Freq: Once | INTRAVENOUS | Status: DC | PRN

## 2021-11-27 MED ORDER — LIDOCAINE 2% (20 MG/ML) 5 ML SYRINGE
INTRAMUSCULAR | Status: AC
  Filled 2021-11-27: qty 5

## 2021-11-27 MED ORDER — OXYCODONE HCL 5 MG PO TABS
5.0000 mg | ORAL_TABLET | ORAL | Status: DC | PRN
  Administered 2021-11-28: 5 mg via ORAL
  Administered 2021-11-28: 10 mg via ORAL
  Administered 2021-11-28: 5 mg via ORAL
  Administered 2021-11-29 – 2021-12-01 (×4): 10 mg via ORAL
  Filled 2021-11-27 (×6): qty 2
  Filled 2021-11-27: qty 1
  Filled 2021-11-27 (×2): qty 2

## 2021-11-27 MED ORDER — ORAL CARE MOUTH RINSE: 15.0000 mL | Freq: Once | OROMUCOSAL | Status: AC

## 2021-11-27 MED ORDER — HYDROMORPHONE HCL 1 MG/ML IJ SOLN
INTRAMUSCULAR | Status: AC
  Filled 2021-11-27: qty 0.5

## 2021-11-27 MED ORDER — DEXMEDETOMIDINE (PRECEDEX) IN NS 20 MCG/5ML (4 MCG/ML) IV SYRINGE
PREFILLED_SYRINGE | INTRAVENOUS | Status: DC | PRN
  Administered 2021-11-27: 8 ug via INTRAVENOUS
  Administered 2021-11-27: 4 ug via INTRAVENOUS
  Administered 2021-11-27: 8 ug via INTRAVENOUS

## 2021-11-27 MED ORDER — PROPOFOL 10 MG/ML IV BOLUS
INTRAVENOUS | Status: AC
  Filled 2021-11-27: qty 20

## 2021-11-27 MED ORDER — BUPIVACAINE LIPOSOME 1.3 % IJ SUSP
INTRAMUSCULAR | Status: DC | PRN
  Administered 2021-11-27: 20 mL

## 2021-11-27 MED ORDER — LIDOCAINE 2% (20 MG/ML) 5 ML SYRINGE
INTRAMUSCULAR | Status: DC | PRN
  Administered 2021-11-27: 100 mg via INTRAVENOUS

## 2021-11-27 MED ORDER — CHLORHEXIDINE GLUCONATE 0.12 % MT SOLN: 15.0000 mL | Freq: Once | OROMUCOSAL | Status: AC

## 2021-11-27 MED ORDER — SUGAMMADEX SODIUM 200 MG/2ML IV SOLN
INTRAVENOUS | Status: DC | PRN
  Administered 2021-11-27: 200 mg via INTRAVENOUS

## 2021-11-27 MED ORDER — ROCURONIUM BROMIDE 10 MG/ML (PF) SYRINGE
PREFILLED_SYRINGE | INTRAVENOUS | Status: AC
  Filled 2021-11-27: qty 10

## 2021-11-27 MED ORDER — VITAMIN D 25 MCG (1000 UNIT) PO TABS
1000.0000 [IU] | ORAL_TABLET | Freq: Every day | ORAL | Status: DC
  Administered 2021-11-28 – 2021-12-03 (×6): 1000 [IU] via ORAL
  Filled 2021-11-27 (×6): qty 1

## 2021-11-27 MED ORDER — SODIUM CHLORIDE 0.9 % IR SOLN
Status: DC | PRN
  Administered 2021-11-27: 3000 mL
  Administered 2021-11-27: 250 mL

## 2021-11-27 MED ORDER — ACETAMINOPHEN 500 MG PO TABS
1000.0000 mg | ORAL_TABLET | Freq: Three times a day (TID) | ORAL | Status: DC
Start: 2021-11-27 — End: 2021-12-03
  Administered 2021-11-27 – 2021-12-03 (×15): 1000 mg via ORAL
  Filled 2021-11-27 (×16): qty 2

## 2021-11-27 MED ORDER — MIDAZOLAM HCL 5 MG/5ML IJ SOLN
INTRAMUSCULAR | Status: DC | PRN
Start: 2021-11-27 — End: 2021-11-27
  Administered 2021-11-27: 2 mg via INTRAVENOUS

## 2021-11-27 MED ORDER — FENTANYL CITRATE (PF) 250 MCG/5ML IJ SOLN
INTRAMUSCULAR | Status: AC
  Filled 2021-11-27: qty 5

## 2021-11-27 MED ORDER — ONDANSETRON HCL 4 MG/2ML IJ SOLN: 4.0000 mg | Freq: Once | INTRAMUSCULAR | Status: DC | PRN

## 2021-11-27 MED ORDER — CEFAZOLIN SODIUM-DEXTROSE 2-4 GM/100ML-% IV SOLN
2.0000 g | Freq: Three times a day (TID) | INTRAVENOUS | Status: AC
  Administered 2021-11-27 – 2021-11-28 (×2): 2 g via INTRAVENOUS
  Filled 2021-11-27: qty 100

## 2021-11-27 MED ORDER — BUPIVACAINE LIPOSOME 1.3 % IJ SUSP
INTRAMUSCULAR | Status: AC
  Filled 2021-11-27: qty 20

## 2021-11-27 MED ORDER — LACTATED RINGERS IV SOLN: INTRAVENOUS | Status: DC

## 2021-11-27 MED ORDER — MIDAZOLAM HCL 2 MG/2ML IJ SOLN
INTRAMUSCULAR | Status: AC
  Filled 2021-11-27: qty 2

## 2021-11-27 MED ORDER — DEXAMETHASONE SODIUM PHOSPHATE 10 MG/ML IJ SOLN
INTRAMUSCULAR | Status: DC | PRN
Start: 2021-11-27 — End: 2021-11-27
  Administered 2021-11-27: 5 mg via INTRAVENOUS

## 2021-11-27 MED ORDER — SODIUM CHLORIDE (PF) 0.9 % IJ SOLN
INTRAMUSCULAR | Status: DC | PRN
  Administered 2021-11-27 (×4): 10 mL

## 2021-11-27 MED ORDER — DEXAMETHASONE SODIUM PHOSPHATE 10 MG/ML IJ SOLN
INTRAMUSCULAR | Status: AC
  Filled 2021-11-27: qty 1

## 2021-11-27 MED ORDER — OXYCODONE HCL 5 MG/5ML PO SOLN: 5.0000 mg | Freq: Once | ORAL | Status: DC | PRN

## 2021-11-27 MED ORDER — ASPIRIN EC 81 MG PO TBEC
81.0000 mg | DELAYED_RELEASE_TABLET | Freq: Two times a day (BID) | ORAL | Status: DC
Start: 2021-11-28 — End: 2021-12-03
  Administered 2021-11-28 – 2021-12-03 (×11): 81 mg via ORAL
  Filled 2021-11-27 (×11): qty 1

## 2021-11-27 MED ORDER — ONDANSETRON HCL 4 MG/2ML IJ SOLN
INTRAMUSCULAR | Status: AC
  Filled 2021-11-27: qty 2

## 2021-11-27 MED ORDER — PROPOFOL 10 MG/ML IV BOLUS
INTRAVENOUS | Status: DC | PRN
  Administered 2021-11-27: 130 mg via INTRAVENOUS

## 2021-11-27 MED ORDER — HYDROMORPHONE HCL 1 MG/ML IJ SOLN: 0.2500 mg | INTRAMUSCULAR | Status: DC | PRN

## 2021-11-27 MED ORDER — PHENYLEPHRINE 40 MCG/ML (10ML) SYRINGE FOR IV PUSH (FOR BLOOD PRESSURE SUPPORT)
PREFILLED_SYRINGE | INTRAVENOUS | Status: AC
  Filled 2021-11-27: qty 10

## 2021-11-27 MED ORDER — PHENYLEPHRINE 40 MCG/ML (10ML) SYRINGE FOR IV PUSH (FOR BLOOD PRESSURE SUPPORT)
PREFILLED_SYRINGE | INTRAVENOUS | Status: DC | PRN
Start: 2021-11-27 — End: 2021-11-27
  Administered 2021-11-27 (×2): 80 ug via INTRAVENOUS
  Administered 2021-11-27: 120 ug via INTRAVENOUS

## 2021-11-27 MED ORDER — CELECOXIB 100 MG PO CAPS
100.0000 mg | ORAL_CAPSULE | Freq: Two times a day (BID) | ORAL | Status: DC
Start: 2021-11-28 — End: 2021-12-03
  Administered 2021-11-28 – 2021-12-03 (×11): 100 mg via ORAL
  Filled 2021-11-27 (×12): qty 1

## 2021-11-27 MED ORDER — ONDANSETRON HCL 4 MG/2ML IJ SOLN
INTRAMUSCULAR | Status: DC | PRN
Start: 2021-11-27 — End: 2021-11-27
  Administered 2021-11-27: 4 mg via INTRAVENOUS

## 2021-11-27 MED ORDER — CHLORHEXIDINE GLUCONATE 0.12 % MT SOLN
OROMUCOSAL | Status: AC
  Administered 2021-11-27: 15 mL via OROMUCOSAL
  Filled 2021-11-27: qty 15

## 2021-11-27 MED ORDER — OXYCODONE HCL 5 MG PO TABS: 5.0000 mg | ORAL_TABLET | Freq: Once | ORAL | Status: DC | PRN

## 2021-11-27 MED ORDER — ALBUMIN HUMAN 5 % IV SOLN: INTRAVENOUS | Status: DC | PRN

## 2021-11-27 MED ORDER — ROCURONIUM BROMIDE 10 MG/ML (PF) SYRINGE
PREFILLED_SYRINGE | INTRAVENOUS | Status: DC | PRN
  Administered 2021-11-27: 70 mg via INTRAVENOUS
  Administered 2021-11-27: 30 mg via INTRAVENOUS

## 2021-11-27 MED ORDER — BUPIVACAINE HCL (PF) 0.25 % IJ SOLN
INTRAMUSCULAR | Status: AC
  Filled 2021-11-27: qty 30

## 2021-11-27 SURGICAL SUPPLY — 60 items
BLADE SAGITTAL 25.0X1.27X90 (BLADE) ×2 IMPLANT
BRUSH FEMORAL CANAL (MISCELLANEOUS) IMPLANT
CHLORAPREP W/TINT 26 (MISCELLANEOUS) ×4 IMPLANT
COVER SURGICAL LIGHT HANDLE (MISCELLANEOUS) ×2 IMPLANT
DERMABOND ADVANCED (GAUZE/BANDAGES/DRESSINGS) ×1
DERMABOND ADVANCED .7 DNX12 (GAUZE/BANDAGES/DRESSINGS) ×1 IMPLANT
DRAPE HALF SHEET 40X57 (DRAPES) ×2 IMPLANT
DRAPE HIP W/POCKET STRL (MISCELLANEOUS) ×2 IMPLANT
DRAPE IMP U-DRAPE 54X76 (DRAPES) ×2 IMPLANT
DRAPE INCISE IOBAN 66X45 STRL (DRAPES) ×2 IMPLANT
DRAPE INCISE IOBAN 85X60 (DRAPES) ×2 IMPLANT
DRAPE U-SHAPE 47X51 STRL (DRAPES) ×4 IMPLANT
DRESSING AQUACEL AG SP 3.5X10 (GAUZE/BANDAGES/DRESSINGS) IMPLANT
DRSG AQUACEL AG ADV 3.5X 6 (GAUZE/BANDAGES/DRESSINGS) IMPLANT
DRSG AQUACEL AG SP 3.5X10 (GAUZE/BANDAGES/DRESSINGS) ×2
ELECT BLADE 4.0 EZ CLEAN MEGAD (MISCELLANEOUS) ×2
ELECT REM PT RETURN 15FT ADLT (MISCELLANEOUS) ×2 IMPLANT
ELECTRODE BLDE 4.0 EZ CLN MEGD (MISCELLANEOUS) ×1 IMPLANT
GLOVE SRG 8 PF TXTR STRL LF DI (GLOVE) ×1 IMPLANT
GLOVE SURG ORTHO LTX SZ8 (GLOVE) ×4 IMPLANT
GLOVE SURG UNDER POLY LF SZ8 (GLOVE) ×1
GOWN STRL REUS W/ TWL XL LVL3 (GOWN DISPOSABLE) ×1 IMPLANT
GOWN STRL REUS W/TWL XL LVL3 (GOWN DISPOSABLE) ×1
HANDPIECE INTERPULSE COAX TIP (DISPOSABLE)
HEAD CERAMIC FEMORAL 36MM (Head) ×1 IMPLANT
HOOD PEEL AWAY FLYTE STAYCOOL (MISCELLANEOUS) ×6 IMPLANT
INSERT TRIDENT POLY 36 0DEG (Insert) ×1 IMPLANT
IRRIGATION SURGIPHOR STRL (IV SOLUTION) IMPLANT
KIT BASIN OR (CUSTOM PROCEDURE TRAY) ×2 IMPLANT
KIT TURNOVER KIT A (KITS) ×2 IMPLANT
MANIFOLD NEPTUNE II (INSTRUMENTS) ×2 IMPLANT
MARKER SKIN DUAL TIP RULER LAB (MISCELLANEOUS) ×2 IMPLANT
NDL 18GX1X1/2 (RX/OR ONLY) (NEEDLE) IMPLANT
NEEDLE 18GX1X1/2 (RX/OR ONLY) (NEEDLE) IMPLANT
NS IRRIG 1000ML POUR BTL (IV SOLUTION) ×2 IMPLANT
PACK TOTAL JOINT (CUSTOM PROCEDURE TRAY) ×2 IMPLANT
RETRIEVER SUT HEWSON (MISCELLANEOUS) ×2 IMPLANT
SCREW HEX LP 6.5X20 (Screw) ×1 IMPLANT
SCREW HEX LP 6.5X30 (Screw) ×1 IMPLANT
SEALER BIPOLAR AQUA 6.0 (INSTRUMENTS) ×2 IMPLANT
SET HNDPC FAN SPRY TIP SCT (DISPOSABLE) IMPLANT
SET INTERPULSE LAVAGE W/TIP (ORTHOPEDIC DISPOSABLE SUPPLIES) ×2 IMPLANT
SHELL CLUSTERHOLE ACETABULAR 5 (Shell) ×1 IMPLANT
SPONGE T-LAP 18X18 ~~LOC~~+RFID (SPONGE) ×4 IMPLANT
STEM 37MM HIP (Hips) ×1 IMPLANT
STOCKINETTE IMPERVIOUS LG (DRAPES) ×2 IMPLANT
SUCTION FRAZIER HANDLE 10FR (MISCELLANEOUS)
SUCTION TUBE FRAZIER 10FR DISP (MISCELLANEOUS) IMPLANT
SUT ETHIBOND 2 V 37 (SUTURE) ×2 IMPLANT
SUT MNCRL AB 3-0 PS2 18 (SUTURE) ×2 IMPLANT
SUT STRATAFIX 1PDS 45CM VIOLET (SUTURE) ×4 IMPLANT
SUT VIC AB 0 CT1 27 (SUTURE) ×1
SUT VIC AB 0 CT1 27XBRD ANBCTR (SUTURE) ×1 IMPLANT
SUT VIC AB 2-0 CT2 27 (SUTURE) ×4 IMPLANT
SYR 20ML LL LF (SYRINGE) IMPLANT
TOWEL GREEN STERILE (TOWEL DISPOSABLE) ×2 IMPLANT
TOWER CARTRIDGE SMART MIX (DISPOSABLE) IMPLANT
TRAY FOLEY MTR SLVR 16FR STAT (SET/KITS/TRAYS/PACK) IMPLANT
TUBE SUCT ARGYLE STRL (TUBING) ×2 IMPLANT
WATER STERILE IRR 1000ML POUR (IV SOLUTION) ×2 IMPLANT

## 2021-11-27 NOTE — Progress Notes (Signed)
Patient arrived back to [place] room 12 alert and oriented x4. Pain level 5. Bed in lowest position. Call light in reach. Orders released. Will continue to monitor. ?

## 2021-11-27 NOTE — Interval H&P Note (Signed)
The patient has been re-examined, and the chart reviewed, and there have been no interval changes to the documented history and physical.   ? ?Patient with right displaced femoral neck fracture.  Pain localized only to the right groin area this morning.  Discussed merits of hemiarthroplasty versus total arthroplasty.  Given the patient's good health, young age, activity level feels should have better longevity and function with total hip arthroplasty.  She is in agreement.  Discussed risks of surgery including infection, nerve injury, bleeding, leg length discrepancy, dislocation, fracture.  She expresses understanding and would like to proceed. ? ?Plan for right total hip arthroplasty today ? ?The operative side was examined and the patient was confirmed to have. Sens DPN, SPN, TN intact, Motor EHL, ext, flex 5/5, and DP 2+, PT 2+, No significant edema. ? ? ?The risks, benefits, and alternatives have been discussed at length with patient, and the patient is willing to proceed.  Right hip marked. Consent has been signed. ? ?

## 2021-11-27 NOTE — Progress Notes (Signed)
Nutrition Brief Note ? ?Consult received as part of hip fx protocol. Pt in surgery at the time of assessment. Discussed in rounds. Pt had a fall while hanging curtains 5 days prior. Reviewed chart, currently no risk factors identified that would prohibit pt from meeting nutrition needs without interventions after surgery. Pt denied recent weight changes or poor appetite ? ?Wt Readings from Last 15 Encounters:  ?11/26/21 97.4 kg  ?03/02/18 91.4 kg  ?02/21/18 92.4 kg  ? ? ?Body mass index is 35.73 kg/m?Marland Kitchen Patient meets criteria for obesity class II based on current BMI.  ? ?Current diet order is regular. Labs and medications reviewed.  ? ?No nutrition interventions warranted at this time. If nutrition issues arise, please consult RD.  ? ?Ranell Patrick, RD, LDN ?Clinical Dietitian ?RD pager # available in [place]  ?After hours/weekend pager # available in [place] ?

## 2021-11-27 NOTE — Op Note (Addendum)
11/25/2021 - 11/27/2021 ? ?9:38 AM ? ?PATIENT:  Sheila Goodwin  ? ?MRN: [id number] ? ?PRE-OPERATIVE DIAGNOSIS: Right displaced femoral neck fracture ? ?POST-OPERATIVE DIAGNOSIS:  same ? ?PROCEDURE:  Procedure(s): ?TOTAL HIP ARTHROPLASTY POSTERIOR ? ?PREOPERATIVE INDICATIONS:   ? ?Sheila Goodwin is an 66 y.o. female who has a diagnosis of Right displaced femoral neck fracture and elected for surgical management after failing conservative treatment.  The risks benefits and alternatives were discussed with the patient including but not limited to the risks of nonoperative treatment, versus surgical intervention including infection, bleeding, nerve injury, periprosthetic fracture, the need for revision surgery, dislocation, leg length discrepancy, blood clots, cardiopulmonary complications, morbidity, mortality, among others, and they were willing to proceed.   ? ?OPERATIVE REPORT  ?   ?SURGEON:  Charlies Constable, MD ?   ?ASSISTANT: Nehemiah Massed, PA-C, (Present throughout the entire procedure,  necessary for completion of procedure in a timely manner, assisting with retraction, instrumentation, and closure)  ?   ?ANESTHESIA: General ? ?ESTIMATED BLOOD LOSS: 700cc ?   ?COMPLICATIONS:  None.  ?   ?UNIQUE ASPECTS OF THE CASE:  none ? ?COMPONENTS:   ?St[employer] Trident 2 acetabular shell, size 7 Accolade 2 neutral polyliner, 36+0 mm head ?Implant Name Type Inv. Item Serial No. Manufacturer Lot No. LRB No. Used Action  ?SHELL CLUSTERHOLE ACETABULAR 5 - [id number] Shell SHELL CLUSTERHOLE ACETABULAR 5  [employer] [id number] Right 1 Implanted  ?SCREW HEX LP 6.5X30 - [id number] Screw SCREW HEX LP 6.5X30  [employer] [id number] Right 1 Implanted  ?INSERT TRIDENT POLY 36 0DEG - [id number] Insert INSERT TRIDENT POLY 36 0DEG  [employer] [id number] Right 1 Implanted  ?SCREW HEX LP 6.5X20 - [id number] Screw SCREW HEX LP 6.5X20  [employer] [id number] Right 1 Implanted  ?STEM 37MM HIP - [id number] Hips STEM 37MM HIP   [employer] [id number] Right 1 Implanted  ?HEAD CERAMIC FEMORAL 36MM - [id number] Head HEAD CERAMIC FEMORAL 36MM  [employer] [id number] Right 1 Implanted  ? ?   ?PROCEDURE IN DETAIL:  ? ?The patient was met in the holding area and  identified.  The appropriate hip was identified and marked at the operative site.  The patient was then transported to the OR  and  placed under anesthesia.  At that point, the patient was  placed in the lateral decubitus position with the operative side up and  secured to the operating room table  and all bony prominences padded. A subaxillary role was also placed. ?   ?The operative lower extremity was prepped from the iliac crest to the distal leg.  Sterile draping was performed.  Time out was performed prior to incision.   ?   ?A routine posterolateral approach was utilized via sharp dissection  carried down to the subcutaneous tissue.  Gross bleeders were Bovie coagulated.  The iliotibial band was identified and incised along the length of the skin incision through the glute max fascia.  Charnley retractor was placed with care to protect the sciatic nerve posteriorly.  With the hip internally rotated, the piriformis tendon was identified and released from the femoral insertion and tagged with a #5 Ethibond.  A capsulotomy was then performed off the femoral insertion and also tagged with a #2 Ethibond.   ? ?The femoral neck was exposed, and the femoral neck cut was made just below the level of the fracture.  Neck cut measured about 11 mm from the lesser trochanter.  The femoral head was easily removed with a  corkscrew. ?   ?I then exposed the deep acetabulum, cleared out any tissue including the ligamentum teres.  After adequate visualization, I excised the labrum.  I then started reaming with a 48 mm reamer, first medializing to the floor of the cotyloid fossa, and then in the position of the cup aiming towards the greater sciatic notch, matching the version of the  transverse acetabular ligament and tucked under the anterior wall. I reamed up to 52 mm reamer with good bony bed preparation and a 52 mm cup was chosen.  The real cup was then impacted into place.  Appropriate version and inclination was confirmed clinically matching their bony anatomy, and also with the use of the jig.  I placed 2 screws in the posterior superior quadrant to augment fixation. ? ?A neutral liner was placed and impacted. It was confirmed to be appropriately seated and the acetabular retractors were removed. ?   ?I then prepared the proximal femur using the box cutter, Charnley awl, and then sequentially broached starting with 0 up to a size 7. ? ?A trial broach, neck, and head was utilized, and I reduced the hip and it was found to have excellent stability.  There was no impingement with full extension and 90 degrees external rotation.  The hip was stable at the position of sleep and with 90 degrees flexion and 90degrees of internal rotation.  Leg lengths were also clinically assessed in the lateral position and felt to be equal. ? ?A final femoral prosthesis size 7 was selected. I then impacted the real femoral prosthesis into place.I again trialed and selected a + 0 ball. and I impacted the real head ball into place. The hip was then reduced and taken through a range of motion. There was no impingement with full extension and 90 degrees external rotation.  The hip was stable at the position of sleep and with 90 degrees flexion and 90degrees of internal rotation. Leg lengths were  again assessed and felt to be restored. ? ?The posterior capsule was then closed with #2 Ethibond.  The piriformis was repaired through the base of the abductor tendon using a [place] suture passer. ? ?I then irrigated the hip copiously again with pulse lavage. Dilute exparal was injected into the wound. Intraop flat plate xray was obtained and components were confirmed to be in good position without fracture. We repaired  the fascia #1 barbed suture, followed by 0 vicryl suture for the subcutaneous fat.  Skin was closed with 2-0 Vicryl and 3-0 Monocryl.  Dermabond and Aquacel dressing were applied. The patient was then awakened and returned to PACU in stable and satisfactory condition.  Leg lengths in the supine position were assessed and felt to be clinically equal. There were no complications. ? ?Post op recs: ?WB: WBAT RLE, posterior hip precautions x6 weeks ?Abx: ancef x23 hours post op ?Imaging: PACU pelvis Xray ?Dressing: Aquacell, keep intact until follow up ?DVT prophylaxis: Aspirin 81BID starting POD1 ?Follow up: 2 weeks after surgery for a wound check with Dr. Zachery Dakins at [facility].  ?Address: [address] [place], [place], [place] [address]  ?Office Phone: [phone] ? ? ?Charlies Constable, MD ?Orthopedic Surgeon ? ? ? ?  ?

## 2021-11-27 NOTE — Progress Notes (Signed)
?PROGRESS NOTE ? ?Sheila Goodwin  MCN:[id number] DOB: 01-12-1956 DOA: 11/25/2021 ?PCP: London Pepper, MD  ? ?Brief Narrative: ?Patient is a 66 year old female with history of asthma who presented after accidental mechanical fall at home while she was working with curtain rod.  After the fall, significant low back pain, right hip pain.  No history of head injury.  On presentation she was hemodynamically stable.  Imaging showed displaced right femoral neck fracture, possible fracture of sacrococcygeal joint.  Orthopedics consulted.  Underwent total hip arthroplasty on 4/13 ? ?Assessment & Plan: ? ?Principal Problem: ?  Closed right hip fracture ([place]) ?Active Problems: ?  Asthma, chronic ? ? ?Right femoral neck fracture: Presented with mechanical fall from home.  Complained of low back pain, right hip pain. ?Imaging showed displaced right femoral neck fracture.  Orthopedics consulted.  Underwent total hip arthroplasty on 4/13 ?Patient has been started on aspirin 81 mg twice daily for DVT prophylaxis, PT/OT will be consulted ? ?Back pain: Imagings also showed possible fracture of the sacrococcygeal joint.  Continue supportive care, pain management. ? ?History of asthma: Continue bronchodilators as needed.  Currently not in exacerbation. ? ?Leukocytosis: Most likely reactive, improving ? ? ?  ?  ?  ? ?DVT prophylaxis:SCDs Start: 11/25/21 2141 ? ? ?  Code Status: Full Code ? ?Family Communication: Husband at bedside on 4/12 ? ?Patient status: Inpatient ? ?Patient is from : Home ? ?Anticipated discharge to: Skilled nursing facility vs Home ? ?Estimated DC date: 2 to 3 days ? ? ?Consultants: Orthopedics ? ?Procedures: Plan for ORIF ? ?Antimicrobials:  ?Anti-infectives (From admission, onward)  ? ? Start     Dose/Rate Route Frequency Ordered Stop  ? 11/27/21 0700  ceFAZolin (ANCEF) IVPB 2g/100 mL premix       ? 2 g ?200 mL/hr over 30 Minutes Intravenous To [facility] 11/26/21 2214 11/27/21 0809  ? ?   ? ? ?Subjective: ?Patient seen and examined at the bedside this morning after surgery.  She just returned from the OR.  Sleepy/drowsy ? ?Objective: ?Vitals:  ? 11/27/21 1037 11/27/21 1052 11/27/21 1107 11/27/21 1122  ?BP: 128/67 124/63 135/74 119/66  ?Pulse: 77 76 81 74  ?Resp: '15 16 16 14  '$ ?Temp: (!) 97.4 ?F (36.3 ?C)     ?TempSrc:      ?SpO2: 95% 97% 94% 94%  ?Weight:      ?Height:      ? ? ?Intake/Output Summary (Last 24 hours) at 11/27/2021 1131 ?Last data filed at 11/27/2021 1038 ?Gross per 24 hour  ?Intake 2950 ml  ?Output 1725 ml  ?Net 1225 ml  ? ?Filed Weights  ? 11/26/21 2217  ?Weight: 97.4 kg  ? ? ?Examination: ? ?General exam: Overall comfortable, not in distress, obese ?HEENT: PERRL ?Respiratory system:  no wheezes or crackles  ?Cardiovascular system: S1 & S2 heard, RRR.  ?Gastrointestinal system: Abdomen is nondistended, soft and nontender. ?Central nervous system: Sleepy, sedated ?Extremities: Surgical wound on the right hip/ice pack ?Skin: No rashes, no ulcers,no icterus   ? ?Data Reviewed: I have personally reviewed following labs and imaging studies ? ?CBC: ?Recent Labs  ?Lab 11/25/21 ?1804 11/26/21 ?0440 11/27/21 ?0120  ?WBC 16.0* 12.7* 12.0*  ?NEUTROABS 11.0*  --  9.6*  ?HGB 15.4* 13.3 13.1  ?HCT 47.3* 40.1 39.3  ?MCV 86.2 86.8 85.6  ?PLT 613* 476* 512*  ? ?Basic Metabolic Panel: ?Recent Labs  ?Lab 11/25/21 ?6294 11/26/21 ?0440  ?NA 140 141  ?K 3.7 3.6  ?CL 104  109  ?CO2 22 25  ?GLUCOSE 104* 104*  ?BUN 30* 29*  ?CREATININE 1.11* 0.86  ?CALCIUM 10.1 8.6*  ? ? ? ?Recent Results (from the past 240 hour(s))  ?Surgical PCR screen     Status: None  ? Collection Time: 11/26/21 11:18 PM  ? Specimen: Nasal Mucosa; Nasal Swab  ?Result Value Ref Range Status  ? MRSA, PCR NEGATIVE NEGATIVE Final  ? Staphylococcus aureus NEGATIVE NEGATIVE Final  ?  Comment: (NOTE) ?The Xpert SA Assay (FDA approved for NASAL specimens in patients 42 ?years of age and older), is one component of a comprehensive ?surveillance  program. It is not intended to diagnose infection nor to ?guide or monitor treatment. ?Performed at [facility], [place] [address] [place], [place] ?[address] ?  ?  ? ?Radiology Studies: ?DG Chest 1 View ? ?Result Date: 11/25/2021 ?CLINICAL DATA:  Recent fall with right hip pain, initial encounter EXAM: CHEST  1 VIEW COMPARISON:  None. FINDINGS: The heart size and mediastinal contours are within normal limits. Both lungs are clear. The visualized skeletal structures are unremarkable. IMPRESSION: No active disease. Electronically Signed   By: Inez Catalina M.D.   On: 11/25/2021 19:08  ? ?DG Lumbar Spine Complete ? ?Result Date: 11/25/2021 ?CLINICAL DATA:  Pain after fall.  Low back pain, fall yesterday. EXAM: LUMBAR SPINE - COMPLETE 4+ VIEW COMPARISON:  None. FINDINGS: The bones are under mineralized. There are suspected 6 non-rib-bearing lumbar vertebra with lumbarization of S1. The lower most fully formed disc space will be labeled S1-S2. Possible fracture at the sacrococcygeal junction with cortical offset, although wider aeration of normal exist in this region. Grade anterolisthesis of L5 on S1 with associated degenerative disc disease and facet hypertrophy. There is moderate L4-L5 and mild L3-L4 facet hypertrophy. Moderate L1-L2 degenerative disc disease. No evidence of acute lumbar fracture. Lumbar vertebral body heights are normal. Limited assessment of the posterior elements due to under mineralization. IMPRESSION: 1. Possible fracture at the sacrococcygeal junction. Recommend correlation with point tenderness. 2. Transitional lumbosacral anatomy with lumbarized S1. Close attention to spinal numbering is recommended if intervention is planned. 3. Multilevel degenerative change with grade 1 anterolisthesis of L5 on S1. 4. Bones are under mineralized. Electronically Signed   By: Keith Rake M.D.   On: 11/25/2021 17:13  ? ?DG Tibia/Fibula Right ? ?Result Date: 11/25/2021 ?CLINICAL DATA:  Recent fall with  known right femoral fracture, initial encounter EXAM: RIGHT TIBIA AND FIBULA - 2 VIEW COMPARISON:  None. FINDINGS: There is no evidence of fracture or other focal bone lesions. Soft tissues are unremarkable. IMPRESSION: No acute abnormality noted. Electronically Signed   By: Inez Catalina M.D.   On: 11/25/2021 19:08  ? ?CT Lumbar Spine Wo Contrast ? ?Result Date: 11/25/2021 ?CLINICAL DATA:  Low back pain.  Fall EXAM: CT LUMBAR SPINE WITHOUT CONTRAST TECHNIQUE: Multidetector CT imaging of the lumbar spine was performed without intravenous contrast administration. Multiplanar CT image reconstructions were also generated. RADIATION DOSE REDUCTION: This exam was performed according to the departmental dose-optimization program which includes automated exposure control, adjustment of the mA and/or kV according to patient size and/or use of iterative reconstruction technique. COMPARISON:  Lumbar radiographs 11/25/2021 FINDINGS: Segmentation: Small ribs at T12 are not visualized on the prior x-ray. Therefore the lowest lumbarized vertebra is L5, not S1 as noted on the prior study. Grade 1 anterolisthesis is at L4-5. Five lumbar vertebra. Alignment: 6 mm anterolisthesis L4-5.  Mild retrolisthesis L3-4 Vertebrae: Negative for lumbar fracture  or mass lesion. Paraspinal and other soft tissues: 4 cm right upper pole renal cyst. Negative for paraspinous mass or adenopathy. Sigmoid diverticulosis. Disc levels: T12-L1: Mild disc degeneration and disc bulging. Negative for stenosis L1-2: Negative L2-3: Mild disc bulging and mild facet degeneration. Negative for stenosis L3-4: Mild disc bulging and moderate facet degeneration. Mild spinal stenosis. Mild subarticular stenosis bilaterally. L4-5: 6 mm anterolisthesis. Severe facet degeneration bilaterally. Moderate spinal stenosis. Negative for spinal or foraminal stenosis L5-S1: Mild facet degeneration.  Negative for stenosis. IMPRESSION: Negative for lumbar fracture Five lumbar  vertebra. Lumbosacral junction is L5-S1. Grade 1 anterolisthesis L4-5 6 mm anterolisthesis L4-5 with moderate spinal stenosis. Electronically Signed   By: Franchot Gallo M.D.   On: 11/25/2021 18:46  ? ?CT PELVIS WO CONTRAST ?

## 2021-11-27 NOTE — Anesthesia Preprocedure Evaluation (Signed)
Anesthesia Evaluation  ?Patient identified by MRN, date of birth, ID band ?Patient awake ? ? ? ?Reviewed: ?Allergy & Precautions, NPO status , Patient's Chart, lab work & pertinent test results ? ?Airway ?Mallampati: II ? ?TM Distance: >3 FB ?Neck ROM: Full ? ? ? Dental ?no notable dental hx. ? ?  ?Pulmonary ?asthma ,  ?  ?Pulmonary exam normal ?breath sounds clear to auscultation ? ? ? ? ? ? Cardiovascular ?negative cardio ROS ?Normal cardiovascular exam ?Rhythm:Regular Rate:Normal ? ? ?  ?Neuro/Psych ?negative neurological ROS ? negative psych ROS  ? GI/Hepatic ?negative GI ROS, Neg liver ROS,   ?Endo/Other  ?negative endocrine ROS ? Renal/GU ?negative Renal ROS  ?negative genitourinary ?  ?Musculoskeletal ?negative musculoskeletal ROS ?(+)  ? Abdominal ?  ?Peds ?negative pediatric ROS ?(+)  Hematology ?negative hematology ROS ?(+)   ?Anesthesia Other Findings ? ? Reproductive/Obstetrics ?negative OB ROS ? ?  ? ? ? ? ? ? ? ? ? ? ? ? ? ?  ?  ? ? ? ? ? ? ? ? ?Anesthesia Physical ?Anesthesia Plan ? ?ASA: 2 ? ?Anesthesia Plan: General  ? ?Post-op Pain Management: Dilaudid IV  ? ?Induction: Intravenous ? ?PONV Risk Score and Plan: 3 and Ondansetron, Dexamethasone and Treatment may vary due to age or medical condition ? ?Airway Management Planned: Oral ETT ? ?Additional Equipment:  ? ?Intra-op Plan:  ? ?Post-operative Plan: Extubation in OR ? ?Informed Consent: I have reviewed the patients History and Physical, chart, labs and discussed the procedure including the risks, benefits and alternatives for the proposed anesthesia with the patient or authorized representative who has indicated his/her understanding and acceptance.  ? ? ? ?Dental advisory given ? ?Plan Discussed with: CRNA and Surgeon ? ?Anesthesia Plan Comments:   ? ? ? ? ? ? ?Anesthesia Quick Evaluation ? ?

## 2021-11-27 NOTE — Interval H&P Note (Signed)
The patient has been re-examined, and the chart reviewed, and there have been no interval changes to the documented history and physical.   ? ?Patient with right displaced femoral neck fracture.  Had an injury a week ago and developed pain in the right groin area at that point.  Progressed to the point where she was unable to weight-bear Tuesday.  Per history sounds like she had a nondisplaced fracture of the unfortunately displaced prior to coming in for imaging.  Given her young age, good health, and activity level discussed the merits of hemiarthroplasty versus total arthroplasty.  I feel she will have better function and longevity with a total hip arthroplasty.  Risks of surgery including infection, nerve injury, bleeding, fracture, leg length discrepancy, dislocation, anesthetic complications were discussed with the patient. ? ?Plan for right total hip arthroplasty for femoral neck fracture today ? ?The operative side was examined and the patient was confirmed to have. Sens DPN, SPN, TN intact, Motor EHL, ext, flex 5/5, and DP 2+, PT 2+, No significant edema. ? ? ?The risks, benefits, and alternatives have been discussed at length with patient, and the patient is willing to proceed.  Right hip marked. Consent has been signed. ? ?

## 2021-11-27 NOTE — Anesthesia Postprocedure Evaluation (Signed)
Anesthesia Post Note ? ?Patient: Sheila Goodwin ? ?Procedure(s) Performed: TOTAL HIP ARTHROPLASTY POSTERIOR (Right: Hip) ? ?  ? ?Patient location during evaluation: PACU ?Anesthesia Type: General ?Level of consciousness: awake ?Pain management: pain level controlled ?Vital Signs Assessment: post-procedure vital signs reviewed and stable ?Respiratory status: spontaneous breathing and respiratory function stable ?Cardiovascular status: stable ?Postop Assessment: no apparent nausea or vomiting ?Anesthetic complications: no ? ? ?No notable events documented. ? ?Last Vitals:  ?Vitals:  ? 11/27/21 1107 11/27/21 1122  ?BP: 135/74 119/66  ?Pulse: 81 74  ?Resp: 16 14  ?Temp:    ?SpO2: 94% 94%  ?  ?Last Pain:  ?Vitals:  ? 11/27/21 1107  ?TempSrc:   ?PainSc: Asleep  ? ? ?  ?  ?  ?  ?  ?  ? ?Merlinda Frederick ? ? ? ? ?

## 2021-11-27 NOTE — Discharge Instructions (Signed)
INSTRUCTIONS AFTER JOINT REPLACEMENT  ? ?Remove items at home which could result in a fall. This includes throw rugs or furniture in walking pathways ?ICE to the affected joint every three hours while awake for 30 minutes at a time, for at least the first 3-5 days, and then as needed for pain and swelling.  Continue to use ice for pain and swelling. You may notice swelling that will progress down to the foot and ankle.  This is normal after surgery.  Elevate your leg when you are not up walking on it.   ?Continue to use the breathing machine you got in the hospital (incentive spirometer) which will help keep your temperature down.  It is common for your temperature to cycle up and down following surgery, especially at night when you are not up moving around and exerting yourself.  The breathing machine keeps your lungs expanded and your temperature down. ? ? ?DIET:  As you were doing prior to hospitalization, we recommend a well-balanced diet. ? ?DRESSING / WOUND CARE / SHOWERING ? ?Keep the surgical dressing until follow up.  The dressing is water proof, so you can shower without any extra covering.  IF THE DRESSING FALLS OFF or the wound gets wet inside, change the dressing with sterile gauze.  Please use good hand washing techniques before changing the dressing.  Do not use any lotions or creams on the incision until instructed by your surgeon.   ? ?ACTIVITY ? ?Increase activity slowly as tolerated, but follow the weight bearing instructions below.   ?No driving for 6 weeks or until further direction given by your physician.  You cannot drive while taking narcotics.  ?No lifting or carrying greater than 10 lbs. until further directed by your surgeon. ?Avoid periods of inactivity such as sitting longer than an hour when not asleep. This helps prevent blood clots.  ?You may return to work once you are authorized by your doctor.  ? ? ? ?WEIGHT BEARING  ? ?Weight bearing as tolerated with assist device (walker, cane,  etc) as directed, use it as long as suggested by your surgeon or therapist, typically at least 4-6 weeks. ? ? ?EXERCISES ? ?Results after joint replacement surgery are often greatly improved when you follow the exercise, range of motion and muscle strengthening exercises prescribed by your doctor. Safety measures are also important to protect the joint from further injury. Any time any of these exercises cause you to have increased pain or swelling, decrease what you are doing until you are comfortable again and then slowly increase them. If you have problems or questions, call your caregiver or physical therapist for advice.  ? ?Rehabilitation is important following a joint replacement. After just a few days of immobilization, the muscles of the leg can become weakened and shrink (atrophy).  These exercises are designed to build up the tone and strength of the thigh and leg muscles and to improve motion. Often times heat used for twenty to thirty minutes before working out will loosen up your tissues and help with improving the range of motion but do not use heat for the first two weeks following surgery (sometimes heat can increase post-operative swelling).  ? ?These exercises can be done on a training (exercise) mat, on the floor, on a table or on a bed. Use whatever works the best and is most comfortable for you.    Use music or television while you are exercising so that the exercises are a pleasant break in your   day. This will make your life better with the exercises acting as a break in your routine that you can look forward to.   Perform all exercises about fifteen times, three times per day or as directed.  You should exercise both the operative leg and the other leg as well. ? ?Exercises include: ?  ?Quad Sets - Tighten up the muscle on the front of the thigh (Quad) and hold for 5-10 seconds.   ?Straight Leg Raises - With your knee straight (if you were given a brace, keep it on), lift the leg to 60  degrees, hold for 3 seconds, and slowly lower the leg.  Perform this exercise against resistance later as your leg gets stronger.  ?Leg Slides: Lying on your back, slowly slide your foot toward your buttocks, bending your knee up off the floor (only go as far as is comfortable). Then slowly slide your foot back down until your leg is flat on the floor again.  ?Angel Wings: Lying on your back spread your legs to the side as far apart as you can without causing discomfort.  ?Hamstring Strength:  Lying on your back, push your heel against the floor with your leg straight by tightening up the muscles of your buttocks.  Repeat, but this time bend your knee to a comfortable angle, and push your heel against the floor.  You may put a pillow under the heel to make it more comfortable if necessary.  ? ?A rehabilitation program following joint replacement surgery can speed recovery and prevent re-injury in the future due to weakened muscles. Contact your doctor or a physical therapist for more information on knee rehabilitation.  ? ? ?CONSTIPATION ? ?Constipation is defined medically as fewer than three stools per week and severe constipation as less than one stool per week.  Even if you have a regular bowel pattern at home, your normal regimen is likely to be disrupted due to multiple reasons following surgery.  Combination of anesthesia, postoperative narcotics, change in appetite and fluid intake all can affect your bowels.  ? ?YOU MUST use at least one of the following options; they are listed in order of increasing strength to get the job done.  They are all available over the counter, and you may need to use some, POSSIBLY even all of these options:   ? ?Drink plenty of fluids (prune juice may be helpful) and high fiber foods ?Colace 100 mg by mouth twice a day  ?Senokot for constipation as directed and as needed Dulcolax (bisacodyl), take with full glass of water  ?Miralax (polyethylene glycol) once or twice a day as  needed. ? ?If you have tried all these things and are unable to have a bowel movement in the first 3-4 days after surgery call either your surgeon or your primary doctor.   ? ?If you experience loose stools or diarrhea, hold the medications until you stool forms back up.  If your symptoms do not get better within 1 week or if they get worse, check with your doctor.  If you experience "the worst abdominal pain ever" or develop nausea or vomiting, please contact the office immediately for further recommendations for treatment. ? ? ?ITCHING:  If you experience itching with your medications, try taking only a single pain pill, or even half a pain pill at a time.  You can also use Benadryl over the counter for itching or also to help with sleep.  ? ?TED HOSE STOCKINGS:  Use stockings on both   legs until for at least 2 weeks or as directed by physician office. They may be removed at night for sleeping. ? ?MEDICATIONS:  See your medication summary on the ?After Visit Summary? that nursing will review with you.  You may have some home medications which will be placed on hold until you complete the course of blood thinner medication.  It is important for you to complete the blood thinner medication as prescribed. ? ? ?Blood clot prevention (DVT Prophylaxis): After surgery you are at an increased risk for a blood clot. you were prescribed a blood thinner, Aspirin '81mg'$ , to be taken twice daily for a total of 4 weeks from surgery to help reduce your risk of getting a blood clot. This will help prevent a blood clot. Signs of a pulmonary embolus (blood clot in the lungs) include sudden short of breath, feeling lightheaded or dizzy, chest pain with a deep breath, rapid pulse rapid breathing. Signs of a blood clot in your arms or legs include new unexplained swelling and cramping, warm, red or darkened skin around the painful area. Please call the office or 911 right away if these signs or symptoms develop. ? ?PRECAUTIONS:  If you  experience chest pain or shortness of breath - call 911 immediately for transfer to the hospital emergency department.  ? ?If you develop a fever greater that 101 F, purulent drainage from wound, increased r

## 2021-11-27 NOTE — Anesthesia Procedure Notes (Signed)
Procedure Name: Intubation ?Date/Time: 11/27/2021 7:37 AM ?Performed by: Colin Benton, CRNA ?Pre-anesthesia Checklist: Patient identified, Emergency Drugs available, Suction available and Patient being monitored ?Patient Re-evaluated:Patient Re-evaluated prior to induction ?Oxygen Delivery Method: Circle system utilized ?Preoxygenation: Pre-oxygenation with 100% oxygen ?Induction Type: IV induction ?Ventilation: Mask ventilation without difficulty ?Laryngoscope Size: Mac and 3 ?Grade View: Grade I ?Tube type: Oral ?Tube size: 7.0 mm ?Number of attempts: 1 ?Airway Equipment and Method: Stylet ?Placement Confirmation: ETT inserted through vocal cords under direct vision, positive ETCO2 and breath sounds checked- equal and bilateral ?Secured at: 22 cm ?Tube secured with: Tape ?Dental Injury: Teeth and Oropharynx as per pre-operative assessment  ? ? ? ? ?

## 2021-11-27 NOTE — Transfer of Care (Signed)
Immediate Anesthesia Transfer of Care Note ? ?Patient: Sheila Goodwin ? ?Procedure(s) Performed: TOTAL HIP ARTHROPLASTY POSTERIOR (Right: Hip) ? ?Patient Location: PACU ? ?Anesthesia Type:General ? ?Level of Consciousness: drowsy ? ?Airway & Oxygen Therapy: Patient Spontanous Breathing and Patient connected to nasal cannula oxygen ? ?Post-op Assessment: Report given to RN and Post -op Vital signs reviewed and stable ? ?Post vital signs: Reviewed and stable ? ?Last Vitals:  ?Vitals Value Taken Time  ?BP 128/67 11/27/21 1037  ?Temp    ?Pulse 74 11/27/21 1038  ?Resp 15 11/27/21 1038  ?SpO2 94 % 11/27/21 1038  ?Vitals shown include unvalidated device data. ? ?Last Pain:  ?Vitals:  ? 11/27/21 0536  ?TempSrc: Oral  ?PainSc:   ?   ? ?  ? ?Complications: No notable events documented. ?

## 2021-11-28 ENCOUNTER — Encounter (HOSPITAL_COMMUNITY): Payer: Self-pay | Admitting: Orthopedic Surgery

## 2021-11-28 DIAGNOSIS — D72829 Elevated white blood cell count, unspecified: Secondary | ICD-10-CM

## 2021-11-28 DIAGNOSIS — D62 Acute posthemorrhagic anemia: Secondary | ICD-10-CM

## 2021-11-28 DIAGNOSIS — S72001A Fracture of unspecified part of neck of right femur, initial encounter for closed fracture: Secondary | ICD-10-CM | POA: Diagnosis not present

## 2021-11-28 LAB — BASIC METABOLIC PANEL
Anion gap: 6 (ref 5–15)
BUN: 24 mg/dL — ABNORMAL HIGH (ref 8–23)
CO2: 23 mmol/L (ref 22–32)
Calcium: 8 mg/dL — ABNORMAL LOW (ref 8.9–10.3)
Chloride: 107 mmol/L (ref 98–111)
Creatinine, Ser: 0.91 mg/dL (ref 0.44–1.00)
GFR, Estimated: >60 mL/min (ref >60)
Glucose, Bld: 122 mg/dL — ABNORMAL HIGH (ref 70–99)
Potassium: 3.9 mmol/L (ref 3.5–5.1)
Sodium: 136 mmol/L (ref 135–145)

## 2021-11-28 LAB — CBC
HCT: 31.6 % — ABNORMAL LOW (ref 36.0–46.0)
Hemoglobin: 10.4 g/dL — ABNORMAL LOW (ref 12.0–15.0)
MCH: 28.7 pg (ref 26.0–34.0)
MCHC: 32.9 g/dL (ref 30.0–36.0)
MCV: 87.3 fL (ref 80.0–100.0)
Platelets: 430 10*3/uL — ABNORMAL HIGH (ref 150–400)
RBC: 3.62 MIL/uL — ABNORMAL LOW (ref 3.87–5.11)
RDW: 13.5 % (ref 11.5–15.5)
WBC: 18.3 10*3/uL — ABNORMAL HIGH (ref 4.0–10.5)
nRBC: 0 % (ref 0.0–0.2)

## 2021-11-28 NOTE — Addendum Note (Signed)
Addendum  created 11/28/21 0732 by Pervis Hocking, DO  ? Intraprocedure Event edited, Intraprocedure Staff edited  ?  ?

## 2021-11-28 NOTE — Evaluation (Signed)
Physical Therapy Evaluation ?Patient Details ?Name: Sheila Goodwin ?MRN: [id number] ?DOB: Feb 20, 1956 ?Today's Date: 11/28/2021 ? ?History of Present Illness ? Elsia is a 66 y.o. female presenting to [facility] after a fall resulting in a proximal femur fx. Pt is s/p R THA posterior approach. PMH includes asthma.  ?Clinical Impression ? Patient presents s/p R THA posterior approach. Pt's impairments include decreased range of motion, activity tolerance, pain modulation, balance, and strength. These impairments are limiting her ability to safely and independently transfer, ambulate, and navigate stairs. Patient requires max A for LE management and occasional min guard A for trunk support with bed mobility. She was mod Ax2 for transfers and standing with RW. Recommend that nursing staff use STEDY for transfers. RN notified. Pt educated on precautions and given handout to reinforce. SPT recommending SNF upon D/C due to mobility deficits. PT will continue to follow acutely to maximize pt's safety and independence with functional mobility. ?   ?   ? ?Recommendations for follow up therapy are one component of a multi-disciplinary discharge planning process, led by the attending physician.  Recommendations may be updated based on patient status, additional functional criteria and insurance authorization. ? ?Follow Up Recommendations Skilled nursing-short term rehab (<3 hours/day) ? ?  ?Assistance Recommended at Discharge Frequent or constant Supervision/Assistance  ?Patient can return home with the following ? Two people to help with walking and/or transfers;A lot of help with bathing/dressing/bathroom;Assistance with cooking/housework;Assist for transportation;Help with stairs or ramp for entrance ? ?  ?Equipment Recommendations Rolling walker (2 wheels)  ?Recommendations for Other Services ?    ?  ?Functional Status Assessment Patient has had a recent decline in their functional status and demonstrates the ability to make  significant improvements in function in a reasonable and predictable amount of time.  ? ?  ?Precautions / Restrictions Precautions ?Precautions: Posterior Hip;Fall ?Precaution Booklet Issued: Yes (comment) ?Precaution Comments: verbally reviewed and demonstrated ?Restrictions ?Weight Bearing Restrictions: Yes ?RLE Weight Bearing: Weight bearing as tolerated  ? ?  ? ?Mobility ? Bed Mobility ?Overal bed mobility: Needs Assistance ?Bed Mobility: Supine to Sit ?  ?  ?Supine to sit: +2 for physical assistance, +2 for safety/equipment, Max assist ?  ?  ?General bed mobility comments: pt required max A for LE management and max cues for bringing hips to EOB. Pt required significant increased time to perform bed mobility mostly due to pain. Occassionaly min guard for trunk required for steadying. ?  ? ?Transfers ?Overall transfer level: Needs assistance ?Equipment used: Rolling walker (2 wheels) ?Transfers: Bed to chair/wheelchair/BSC ?  ?Stand pivot transfers: Mod assist, +2 physical assistance, +2 safety/equipment, From elevated surface ?  ?  ?  ?  ?General transfer comment: pt required mod Ax2 and increased time to power up while using RW. Pt very anxious to stand at EOB. Pt need cuing for hand placement, and standing up tall during stand pivot. Pt cued on how to pivot while maintaining precautions. Further mobility deferred for safety. ?  ? ?Ambulation/Gait ?  ?  ?  ?  ?  ?  ?  ?  ? ?Stairs ?  ?  ?  ?  ?  ? ?Wheelchair Mobility ?  ? ?Modified Rankin (Stroke Patients Only) ?  ? ?  ? ?Balance Overall balance assessment: Needs assistance ?Sitting-balance support: Bilateral upper extremity supported, Feet supported ?Sitting balance-Leahy Scale: Poor ?Sitting balance - Comments: pt required b/l UE support in sitting ?  ?Standing balance support: Bilateral upper extremity supported, During  functional activity, Reliant on assistive device for balance ?Standing balance-Leahy Scale: Poor ?Standing balance comment: pt required  mod Ax2 for power up and steadying in standing. Pt reliant on RW for BUE support. ?  ?  ?  ?  ?  ?  ?  ?  ?  ?  ?  ?   ? ? ? ?Pertinent Vitals/Pain Pain Assessment ?Pain Assessment: Faces ?Faces Pain Scale: Hurts whole lot ?Pain Location: R hip operative site ?Pain Descriptors / Indicators: Sharp, Tender, Sore, Grimacing, Guarding ?Pain Intervention(s): Monitored during session, Limited activity within patient's tolerance  ? ? ?Home Living Family/patient expects to be discharged to:: Private residence ?Living Arrangements: Spouse/significant other ?Available Help at Discharge: Family;Available PRN/intermittently ?Type of Home: House ?Home Access: Stairs to enter;Ramped entrance ?Entrance Stairs-Rails: Right;Left ?Entrance Stairs-Number of Steps: 4 in the back ?  ?Home Layout: One level ?Home Equipment: Rollator (4 wheels);Shower seat;Crutches;Cane - single point;Toilet riser ?Additional Comments: ramped entrance has railings. Husband is Dealer and works during the day. Pt is retired  ?  ?Prior Function Prior Level of Function : Independent/Modified Independent ?  ?  ?  ?  ?  ?  ?  ?  ?  ? ? ?Hand Dominance  ?   ? ?  ?Extremity/Trunk Assessment  ? Upper Extremity Assessment ?Upper Extremity Assessment: Defer to OT evaluation ?  ? ?Lower Extremity Assessment ?Lower Extremity Assessment: RLE deficits/detail ?RLE Deficits / Details: consistent with R THA posterior approach ?  ? ?Cervical / Trunk Assessment ?Cervical / Trunk Assessment: Kyphotic  ?Communication  ? Communication: HOH (wears hearing aides)  ?Cognition Arousal/Alertness: Awake/alert ?Behavior During Therapy: Anxious ?Overall Cognitive Status: Within Functional Limits for tasks assessed ?  ?  ?  ?  ?  ?  ?  ?  ?  ?  ?  ?  ?  ?  ?  ?  ?General Comments: pt very anxious and cautious with mobility. Pt repeated "i'm scared" during session. ?  ?  ? ?  ?General Comments   ? ?  ?Exercises    ? ?Assessment/Plan  ?  ?PT Assessment Patient needs continued PT  services  ?PT Problem List Decreased strength;Decreased range of motion;Decreased activity tolerance;Decreased balance;Decreased mobility;Pain;Decreased knowledge of precautions ? ?   ?  ?PT Treatment Interventions DME instruction;Gait training;Stair training;Functional mobility training;Therapeutic activities;Therapeutic exercise;Balance training;Neuromuscular re-education;Patient/family education   ? ?PT Goals (Current goals can be found in the Care Plan section)  ?Acute Rehab PT Goals ?Patient Stated Goal: to go to rehab ?PT Goal Formulation: With patient ?Time For Goal Achievement: 12/12/21 ?Potential to Achieve Goals: Good ? ?  ?Frequency Min 3X/week ?  ? ? ?Co-evaluation   ?  ?  ?  ?  ? ? ?  ?AM-PAC PT "6 Clicks" Mobility  ?Outcome Measure Help needed turning from your back to your side while in a flat bed without using bedrails?: A Lot ?Help needed moving from lying on your back to sitting on the side of a flat bed without using bedrails?: A Lot ?Help needed moving to and from a bed to a chair (including a wheelchair)?: Total ?Help needed standing up from a chair using your arms (e.g., wheelchair or bedside chair)?: Total ?Help needed to walk in hospital room?: Total ?Help needed climbing 3-5 steps with a railing? : Total ?6 Click Score: 8 ? ?  ?End of Session Equipment Utilized During Treatment: Gait belt ?Activity Tolerance: Patient limited by pain ?Patient left: in chair;with chair alarm set;with call  bell/phone within reach ?Nurse Communication: Mobility status;Other (comment) (using STEADY to transfer pt back to bed) ?PT Visit Diagnosis: Other abnormalities of gait and mobility (R26.89);Muscle weakness (generalized) (M62.81);Unsteadiness on feet (R26.81);Pain ?Pain - Right/Left: Right ?Pain - part of body: Hip ?  ? ?Time: 1110-1200 ?PT Time Calculation (min) (ACUTE ONLY): 50 min ? ? ?Charges:   PT Evaluation ?$PT Eval Moderate Complexity: 1 Mod ?PT Treatments ?$Gait Training: 23-37 mins ?  ?    ? ? ?Jonne Ply, SPT ? ?Jonne Ply ?11/28/2021, 1:17 PM ? ?

## 2021-11-28 NOTE — Care Management Important Message (Signed)
Important Message ? ?Patient Details  ?Name: Sheila Goodwin ?MRN: [id number] ?Date of Birth: Jul 23, 1956 ? ? ?Medicare Important Message Given:  Yes ? ? ? ? ?[name] ?11/28/2021, 2:30 PM ?

## 2021-11-28 NOTE — Progress Notes (Signed)
?PROGRESS NOTE ? ?Sheila Goodwin  XTK:[id number] DOB: 11-16-1955 DOA: 11/25/2021 ?PCP: London Pepper, MD  ? ?Brief Narrative: ?Patient is a 66 year old female with history of asthma who presented after accidental mechanical fall at home while she was working with curtain rod.  After the fall, significant low back pain, right hip pain.  No history of head injury.  On presentation she was hemodynamically stable.  Imaging showed displaced right femoral neck fracture, possible fracture of sacrococcygeal joint.  Orthopedics consulted.  Underwent total hip arthroplasty on 4/13.  PT recommended skilled nursing facility on discharge. ? ?Assessment & Plan: ? ?Principal Problem: ?  Closed right hip fracture ([place]) ?Active Problems: ?  Asthma, chronic ?  ABLA (acute blood loss anemia) ?  Leucocytosis ? ? ?Right femoral neck fracture: Presented with mechanical fall from home.  Complained of low back pain, right hip pain. ?Imaging showed displaced right femoral neck fracture.  Orthopedics consulted.  Underwent total hip arthroplasty on 4/13 ?Patient has been started on aspirin 81 mg twice daily for DVT prophylaxis, PT/OT consulted, recommended skilled nursing facility on discharge.  TOC notified ? ?Back pain: Imagings also showed possible fracture of the sacrococcygeal joint.  Currently denies any back pain continue supportive care, pain management. ? ?History of asthma: Continue bronchodilators as needed.  Currently not in exacerbation. ? ?Leukocytosis: Most likely reactive, improving but again went up today.  Low suspicious for infectious etiology.  Monitor ? ?Acute blood loss anemia: Hemoglobin dropped from 13 to the range of 10.  Likely associated with surgery.  Check CBC tomorrow ? ?Obesity: BMI of 35.7 ? ? ?  ?  ?  ? ?DVT prophylaxis:SCDs Start: 11/25/21 2141 ? ? ?  Code Status: Full Code ? ?Family Communication: Husband at bedside on 4/12 ? ?Patient status: Inpatient ? ?Patient is from : Home ? ?Anticipated discharge  to: Skilled nursing facility  ? ?Estimated DC date: 2 to 3 days ? ? ?Consultants: Orthopedics ? ?Procedures: Plan for ORIF ? ?Antimicrobials:  ?Anti-infectives (From admission, onward)  ? ? Start     Dose/Rate Route Frequency Ordered Stop  ? 11/27/21 1530  ceFAZolin (ANCEF) IVPB 2g/100 mL premix       ? 2 g ?200 mL/hr over 30 Minutes Intravenous Every 8 hours 11/27/21 1207 11/28/21 0103  ? 11/27/21 0700  ceFAZolin (ANCEF) IVPB 2g/100 mL premix       ? 2 g ?200 mL/hr over 30 Minutes Intravenous To [facility] 11/26/21 2214 11/27/21 0809  ? ?  ? ? ?Subjective: ?Patient seen and examined at the bedside this morning.  Hemodynamically stable.  Overall comfortable.  Denies any complaints.  She is interested to go to skilled nursing facilty if recommended. ? ?Objective: ?Vitals:  ? 11/28/21 0028 11/28/21 0514 11/28/21 0758 11/28/21 1102  ?BP: (!) 106/43 (!) 118/59 115/70   ?Pulse: 82 98 88 88  ?Resp: '18 18 15   '$ ?Temp: 97.7 ?F (36.5 ?C) 98.3 ?F (36.8 ?C) 98.6 ?F (37 ?C)   ?TempSrc: Oral Oral Oral   ?SpO2: 94% 92% (!) 88% 98%  ?Weight:      ?Height:      ? ? ?Intake/Output Summary (Last 24 hours) at 11/28/2021 1329 ?Last data filed at 11/28/2021 1109 ?Gross per 24 hour  ?Intake 681.38 ml  ?Output 2575 ml  ?Net -1893.62 ml  ? ?Filed Weights  ? 11/26/21 2217  ?Weight: 97.4 kg  ? ? ?Examination: ? ?General exam: Overall comfortable, not in distress, obese ?HEENT: PERRL ?Respiratory system:  no wheezes or crackles  ?Cardiovascular system: S1 & S2 heard, RRR.  ?Gastrointestinal system: Abdomen is nondistended, soft and nontender. ?Central nervous system: Alert and oriented ?Extremities: No edema, no clubbing ,no cyanosis, surgical wound on the right hip ?Skin: No rashes, no ulcers,no icterus   ? ?Data Reviewed: I have personally reviewed following labs and imaging studies ? ?CBC: ?Recent Labs  ?Lab 11/25/21 ?2595 11/26/21 ?6387 11/27/21 ?0120 11/28/21 ?0105  ?WBC 16.0* 12.7* 12.0* 18.3*  ?NEUTROABS 11.0*  --  9.6*  --   ?HGB  15.4* 13.3 13.1 10.4*  ?HCT 47.3* 40.1 39.3 31.6*  ?MCV 86.2 86.8 85.6 87.3  ?PLT 613* 476* 512* 430*  ? ?Basic Metabolic Panel: ?Recent Labs  ?Lab 11/25/21 ?1804 11/26/21 ?0440 11/28/21 ?0105  ?NA 140 141 136  ?K 3.7 3.6 3.9  ?CL 104 109 107  ?CO2 '22 25 23  '$ ?GLUCOSE 104* 104* 122*  ?BUN 30* 29* 24*  ?CREATININE 1.11* 0.86 0.91  ?CALCIUM 10.1 8.6* 8.0*  ? ? ? ?Recent Results (from the past 240 hour(s))  ?Surgical PCR screen     Status: None  ? Collection Time: 11/26/21 11:18 PM  ? Specimen: Nasal Mucosa; Nasal Swab  ?Result Value Ref Range Status  ? MRSA, PCR NEGATIVE NEGATIVE Final  ? Staphylococcus aureus NEGATIVE NEGATIVE Final  ?  Comment: (NOTE) ?The Xpert SA Assay (FDA approved for NASAL specimens in patients 24 ?years of age and older), is one component of a comprehensive ?surveillance program. It is not intended to diagnose infection nor to ?guide or monitor treatment. ?Performed at [facility], [place] [address], [place], [place] ?[address] ?  ?  ? ?Radiology Studies: ?DG HIP UNILAT WITH PELVIS 1V RIGHT ? ?Result Date: 11/27/2021 ?CLINICAL DATA:  Right total hip arthroplasty for femoral neck fracture. EXAM: DG HIP (WITH OR WITHOUT PELVIS) 1V RIGHT COMPARISON:  None. FINDINGS: Frontal view obtained intraoperatively demonstrates normal alignment of a total right hip arthroplasty. No fracture or abnormality surrounding arthroplasty components. IMPRESSION: Normal appearance of right total hip arthroplasty. Electronically Signed   By: Aletta Edouard M.D.   On: 11/27/2021 10:16  ? ?DG HIP UNILAT W OR W/O PELVIS 2-3 VIEWS RIGHT ? ?Result Date: 11/27/2021 ?CLINICAL DATA:  Postoperative state.  Done in PACU. EXAM: DG HIP (WITH OR WITHOUT PELVIS) 2-3V RIGHT COMPARISON:  Pelvis and right hip radiographs 11/25/2021 FINDINGS: Interval total right hip arthroplasty. No perihardware lucency is seen to indicate hardware failure or loosening. Expected postoperative changes including lateral hip soft tissue swelling,  subcutaneous air, and right hip intra-articular air. No acute fracture or dislocation. IMPRESSION: Interval total right hip arthroplasty without evidence of hardware failure. Electronically Signed   By: Yvonne Kendall M.D.   On: 11/27/2021 11:21   ? ?Scheduled Meds: ? acetaminophen  1,000 mg Oral Q8H  ? aspirin EC  81 mg Oral BID  ? calcium-vitamin D  1 tablet Oral Daily  ? celecoxib  100 mg Oral BID  ? cholecalciferol  1,000 Units Oral Daily  ? midazolam  1 mg Intravenous Once  ? multivitamin with minerals  1 tablet Oral Daily  ? mupirocin ointment  1 application. Nasal BID  ? ?Continuous Infusions: ? ? ? ? LOS: 3 days  ? ?Shelly Coss, MD ?[facility] ?P4/14/2023, 1:29 PM   ?

## 2021-11-28 NOTE — Evaluation (Signed)
Occupational Therapy Evaluation ?Patient Details ?Name: Sheila Goodwin ?MRN: [id number] ?DOB: 1956/02/23 ?Today's Date: 11/28/2021 ? ? ?History of Present Illness [name] is a 66 y.o. female presenting to [facility] after a fall resulting in a proximal femur fx. Pt is s/p R THA posterior approach. PMH includes asthma.  ? ?Clinical Impression ?  ?Pt is independent at baseline. Presents with R hip pain, generalized weakness and impaired standing balance. Moderate assistance with stedy needed to stand and transfer from chair to bed. Moderate assistance needed to return to supine. Pt requires set up to total assist for ADLs. She will need post acute OT in SNF prior to return home and is in agreement with this recommendation as her husband works. Will follow acutely. ?   ? ?Recommendations for follow up therapy are one component of a multi-disciplinary discharge planning process, led by the attending physician.  Recommendations may be updated based on patient status, additional functional criteria and insurance authorization.  ? ?Follow Up Recommendations ? Skilled nursing-short term rehab (<3 hours/day)  ?  ?Assistance Recommended at Discharge Frequent or constant Supervision/Assistance  ?Patient can return home with the following Two people to help with walking and/or transfers;A lot of help with bathing/dressing/bathroom;Assistance with cooking/housework;Assist for transportation;Help with stairs or ramp for entrance ? ?  ?Functional Status Assessment ? Patient has had a recent decline in their functional status and demonstrates the ability to make significant improvements in function in a reasonable and predictable amount of time.  ?Equipment Recommendations ? BSC/3in1  ?  ?Recommendations for Other Services   ? ? ?  ?Precautions / Restrictions Precautions ?Precautions: Posterior Hip;Fall ?Precaution Booklet Issued: Yes (comment) ?Precaution Comments: verbally reviewed and demonstrated ?Restrictions ?Weight Bearing  Restrictions: Yes ?RLE Weight Bearing: Weight bearing as tolerated  ? ?  ? ?Mobility Bed Mobility ?Overal bed mobility: Needs Assistance ?Bed Mobility: Sit to Supine ?  ?  ?  ?Sit to supine: Mod assist ?  ?General bed mobility comments: assist for LEs back into bed ?  ? ?Transfers ?Overall transfer level: Needs assistance ?Equipment used: Ambulation equipment used ?Transfers: Sit to/from Stand ?Sit to Stand: Mod assist ?  ?  ?  ?  ?  ?General transfer comment: assist to rise using stedy ?  ? ?  ?Balance Overall balance assessment: Needs assistance ?  ?Sitting balance-Leahy Scale: Good ?Sitting balance - Comments: no UE use, performed grooming at EOB ?  ?Standing balance support: Bilateral upper extremity supported, During functional activity, Reliant on assistive device for balance ?Standing balance-Leahy Scale: Poor ?  ?  ?  ?  ?  ?  ?  ?  ?  ?  ?  ?  ?   ? ?ADL either performed or assessed with clinical judgement  ? ?ADL Overall ADL's : Needs assistance/impaired ?Eating/Feeding: Independent;Sitting ?  ?Grooming: Brushing hair;Oral care;Sitting;Set up ?  ?Upper Body Bathing: Set up;Sitting ?  ?Lower Body Bathing: Total assistance;Sit to/from stand ?  ?Upper Body Dressing : Set up;Sitting ?  ?Lower Body Dressing: Total assistance;Sit to/from stand ?  ?  ?  ?  ?  ?  ?  ?  ?   ? ? ? ?Vision Baseline Vision/History: 1 Wears glasses ?Ability to See in Adequate Light: 0 Adequate ?Patient Visual Report: No change from baseline ?   ?   ?Perception   ?  ?Praxis   ?  ? ?Pertinent Vitals/Pain Pain Assessment ?Pain Assessment: Faces ?Faces Pain Scale: Hurts little more ?Pain Location: R hip operative site ?  Pain Descriptors / Indicators: Discomfort, Grimacing, Guarding ?Pain Intervention(s): Ice applied, Repositioned, Monitored during session, Premedicated before session  ? ? ? ?Hand Dominance Right ?  ?Extremity/Trunk Assessment Upper Extremity Assessment ?Upper Extremity Assessment: Overall WFL for tasks assessed ?  ?Lower  Extremity Assessment ?Lower Extremity Assessment: Defer to PT evaluation ?RLE Deficits / Details: consistent with R THA posterior approach ?  ?Cervical / Trunk Assessment ?Cervical / Trunk Assessment: Kyphotic ?  ?Communication Communication ?Communication: HOH (wears hearing aids) ?  ?Cognition Arousal/Alertness: Awake/alert ?Behavior During Therapy: Anxious ?Overall Cognitive Status: Within Functional Limits for tasks assessed ?  ?  ?  ?  ?  ?  ?  ?  ?  ?  ?  ?  ?  ?  ?  ?  ?  ?  ?  ?General Comments    ? ?  ?Exercises   ?  ?Shoulder Instructions    ? ? ?Home Living Family/patient expects to be discharged to:: Private residence ?Living Arrangements: Spouse/significant other ?Available Help at Discharge: Family;Available PRN/intermittently ?Type of Home: House ?Home Access: Stairs to enter;Ramped entrance ?Entrance Stairs-Number of Steps: 4 in the back ?Entrance Stairs-Rails: Right;Left ?Home Layout: One level ?  ?  ?Bathroom Shower/Tub: Walk-in shower ?  ?Bathroom Toilet: Standard ?  ?  ?Home Equipment: Rollator (4 wheels);Shower seat;Crutches;Cane - single point;Toilet riser ?  ?Additional Comments: ramped entrance has railings. Husband is Dealer and works during the day. Pt is retired ?  ? ?  ?Prior Functioning/Environment Prior Level of Function : Independent/Modified Independent ?  ?  ?  ?  ?  ?  ?  ?  ?  ? ?  ?  ?OT Problem List: Decreased strength;Impaired balance (sitting and/or standing);Decreased knowledge of use of DME or AE;Decreased knowledge of precautions;Pain ?  ?   ?OT Treatment/Interventions: Self-care/ADL training;DME and/or AE instruction;Therapeutic activities;Patient/family education;Balance training  ?  ?OT Goals(Current goals can be found in the care plan section) Acute Rehab OT Goals ?OT Goal Formulation: With patient ?Time For Goal Achievement: 12/12/21 ?Potential to Achieve Goals: Good  ?OT Frequency: Min 2X/week ?  ? ?Co-evaluation   ?  ?  ?  ?  ? ?  ?AM-PAC OT "6 Clicks" Daily  Activity     ?Outcome Measure Help from another person eating meals?: None ?Help from another person taking care of personal grooming?: A Little ?Help from another person toileting, which includes using toliet, bedpan, or urinal?: A Lot ?Help from another person bathing (including washing, rinsing, drying)?: A Lot ?Help from another person to put on and taking off regular upper body clothing?: A Little ?Help from another person to put on and taking off regular lower body clothing?: Total ?6 Click Score: 15 ?  ?End of Session Equipment Utilized During Treatment: Gait belt ? ?Activity Tolerance: Patient tolerated treatment well ?Patient left: in bed;with call bell/phone within reach;with bed alarm set ? ?OT Visit Diagnosis: Unsteadiness on feet (R26.81);Pain;Muscle weakness (generalized) (M62.81) ?Pain - Right/Left: Right ?Pain - part of body: Hip  ?              ?Time: 5102-5852 ?OT Time Calculation (min): 38 min ?Charges:  OT General Charges ?$OT Visit: 1 Visit ?OT Evaluation ?$OT Eval Moderate Complexity: 1 Mod ?OT Treatments ?$Self Care/Home Management : 23-37 mins ? ?Nestor Lewandowsky, OTR/L ?Acute Rehabilitation Services ?Pager: [phone] ?Office: [phone]  ?Malka So ?11/28/2021, 2:32 PM ?

## 2021-11-28 NOTE — TOC CAGE-AID Note (Signed)
Transition of Care (TOC) - CAGE-AID Screening ? ? ?Patient Details  ?Name: Shantaya Bluestone Lamagna ?MRN: [id number] ?Date of Birth: Oct 06, 1955 ? ?Transition of Care (TOC) CM/SW Contact:    ?Chioma Mukherjee C Tarpley-Carter, LCSWA ?Phone Number: ?11/28/2021, 10:16 AM ? ? ?Clinical Narrative: ?Pt participated in [place].  Pt stated she does not use substance or ETOH to RN.  Pt was not offered resources, due to no usage of substance or ETOH.    ? ?Passenger transport manager, MSW, LCSW-A ?Pronouns:  She/Her/Hers ?[facility]itions of Care ?Clinical Social Worker ?Direct Number:  [phone] ?[email or web address] ? ?CAGE-AID Screening: ?  ? ?Have You Ever Felt You Ought to Cut Down on Your Drinking or Drug Use?: No ?Have People Annoyed You By Critizing Your Drinking Or Drug Use?: No ?Have You Felt Bad Or Guilty About Your Drinking Or Drug Use?: No ?Have You Ever Had a Drink or Used Drugs First Thing In The Morning to Steady Your Nerves or to Get Rid of a Hangover?: No ?CAGE-AID Score: 0 ? ?Substance Abuse Education Offered: No ? ?  ? ? ? ? ? ? ?

## 2021-11-29 DIAGNOSIS — S72001A Fracture of unspecified part of neck of right femur, initial encounter for closed fracture: Secondary | ICD-10-CM | POA: Diagnosis not present

## 2021-11-29 DIAGNOSIS — D72829 Elevated white blood cell count, unspecified: Secondary | ICD-10-CM | POA: Diagnosis not present

## 2021-11-29 LAB — URINALYSIS, ROUTINE W REFLEX MICROSCOPIC
Bilirubin Urine: NEGATIVE
Glucose, UA: NEGATIVE mg/dL
Hgb urine dipstick: NEGATIVE
Ketones, ur: NEGATIVE mg/dL
Nitrite: NEGATIVE
Protein, ur: NEGATIVE mg/dL
Specific Gravity, Urine: 1.019 (ref 1.005–1.030)
pH: 5 (ref 5.0–8.0)

## 2021-11-29 LAB — CBC WITH DIFFERENTIAL/PLATELET
Abs Immature Granulocytes: 0.11 10*3/uL — ABNORMAL HIGH (ref 0.00–0.07)
Basophils Absolute: 0.1 10*3/uL (ref 0.0–0.1)
Basophils Relative: 1 %
Eosinophils Absolute: 0.2 10*3/uL (ref 0.0–0.5)
Eosinophils Relative: 1 %
HCT: 30.1 % — ABNORMAL LOW (ref 36.0–46.0)
Hemoglobin: 10.1 g/dL — ABNORMAL LOW (ref 12.0–15.0)
Immature Granulocytes: 1 %
Lymphocytes Relative: 20 %
Lymphs Abs: 3.3 10*3/uL (ref 0.7–4.0)
MCH: 29.2 pg (ref 26.0–34.0)
MCHC: 33.6 g/dL (ref 30.0–36.0)
MCV: 87 fL (ref 80.0–100.0)
Monocytes Absolute: 2.2 10*3/uL — ABNORMAL HIGH (ref 0.1–1.0)
Monocytes Relative: 13 %
Neutro Abs: 11 10*3/uL — ABNORMAL HIGH (ref 1.7–7.7)
Neutrophils Relative %: 64 %
Platelets: 423 10*3/uL — ABNORMAL HIGH (ref 150–400)
RBC: 3.46 MIL/uL — ABNORMAL LOW (ref 3.87–5.11)
RDW: 13.7 % (ref 11.5–15.5)
WBC: 16.8 10*3/uL — ABNORMAL HIGH (ref 4.0–10.5)
nRBC: 0 % (ref 0.0–0.2)

## 2021-11-29 NOTE — Progress Notes (Addendum)
?PROGRESS NOTE ? ?Sheila Goodwin  TFT:[id number] DOB: 1956/06/02 DOA: 11/25/2021 ?PCP: London Pepper, MD  ? ?Brief Narrative: ?Patient is a 66 year old female with history of asthma who presented after accidental mechanical fall at home while she was working with curtain rod.  After the fall, significant low back pain, right hip pain.  No history of head injury.  On presentation she was hemodynamically stable.  Imaging showed displaced right femoral neck fracture, possible fracture of sacrococcygeal joint.  Orthopedics consulted.  Underwent total hip arthroplasty on 4/13.  PT recommended skilled nursing facility on discharge. ?-11/29/21--patient remains medically stable for transfer to SNF rehab when bed becomes available and insurance authorization is obtained ? ?Assessment & Plan: ? ?Principal Problem: ?  Closed right hip fracture ([place]) ?Active Problems: ?  Asthma, chronic ?  ABLA (acute blood loss anemia) ?  Leucocytosis ? ? ?Right femoral neck fracture: Presented with mechanical fall from home.  Complained of low back pain, right hip pain. ?Imaging showed displaced right femoral neck fracture.  Orthopedics consulted.  Underwent total hip arthroplasty on 4/13 ?Patient has been started on aspirin 81 mg twice daily for DVT prophylaxis, PT/OT consulted, recommended skilled nursing facility on discharge.  TOC notified ? ?Back pain: Imagings also showed possible fracture of the sacrococcygeal joint.  Currently denies any back pain continue supportive care, pain management. ? ?History of asthma: Continue bronchodilators as needed.  Currently not in exacerbation. ? ?Leukocytosis: Most likely reactive,  ?-WBC down to 16.8 from 18.3, no evidence of acute infection at this time ?-Check UA ? ?Acute blood loss anemia: Hemoglobin dropped from 13 to the range of 10.  Likely associated with surgery. ?-Hgb remains around 10 ? ?Obesity: BMI of 35.7 ?  ?  ? ?DVT prophylaxis:SCDs Start: 11/25/21 2141 ? ? ?  Code Status: Full  Code ? ?Family Communication: Previously updated husband by team member ?Patient status: Inpatient ? ?Patient is from : Home ? ?Anticipated discharge to: Skilled nursing facility  ? ?Estimated DC date: 11/29/21--patient remains medically stable for transfer to SNF rehab when bed becomes available and insurance authorization is obtained ? ? ?Consultants: Orthopedics ? ?Procedures: Plan for ORIF ? ?Antimicrobials:  ?Anti-infectives (From admission, onward)  ? ? Start     Dose/Rate Route Frequency Ordered Stop  ? 11/27/21 1530  ceFAZolin (ANCEF) IVPB 2g/100 mL premix       ? 2 g ?200 mL/hr over 30 Minutes Intravenous Every 8 hours 11/27/21 1207 11/28/21 0103  ? 11/27/21 0700  ceFAZolin (ANCEF) IVPB 2g/100 mL premix       ? 2 g ?200 mL/hr over 30 Minutes Intravenous To [facility] 11/26/21 2214 11/27/21 0809  ? ?  ? ? ?Subjective: ?- ?No new complaints resting comfortably ?-Pain control is adequate ?Eating and drinking okay ? ?No Nausea, Vomiting or Diarrhea ?No fever  Or chills  ?11/29/21--patient remains medically stable for transfer to SNF rehab when bed becomes available and insurance authorization is obtained ? ? ?Objective: ?Vitals:  ? 11/29/21 0516 11/29/21 0719 11/29/21 1100 11/29/21 1539  ?BP: 115/61 121/62  94/68  ?Pulse: 87 89  93  ?Resp: '18 16  16  '$ ?Temp: 98.4 ?F (36.9 ?C) 98 ?F (36.7 ?C)  97.8 ?F (36.6 ?C)  ?TempSrc:  Oral  Oral  ?SpO2: 92% 98% 96% 100%  ?Weight:      ?Height:      ? ? ?Intake/Output Summary (Last 24 hours) at 11/29/2021 1949 ?Last data filed at 11/29/2021 0500 ?Gross per 24 hour  ?  Intake --  ?Output 200 ml  ?Net -200 ml  ? ?Filed Weights  ? 11/26/21 2217  ?Weight: 97.4 kg  ? ? ?Examination: ? ? ?Physical Exam ? ?Gen:- Awake Alert, in no acute distress  ?HEENT:- Lane.AT, No sclera icterus ?Neck-Supple Neck,No JVD,.  ?Lungs-  CTAB , fair air movement bilaterally  ?CV- S1, S2 normal, RRR ?Abd-  +ve B.Sounds, Abd Soft, No tenderness,    ?Extremity/Skin:- No  edema,   good pedal pulses   ?Psych-affect is appropriate, oriented x3 ?Neuro-no new focal deficits, no tremors ?MSK-right hip wound is clean dry and intact ? ?Data Reviewed: I have personally reviewed following labs and imaging studies ? ?CBC: ?Recent Labs  ?Lab 11/25/21 ?4628 11/26/21 ?6381 11/27/21 ?0120 11/28/21 ?0105 11/29/21 ?7711  ?WBC 16.0* 12.7* 12.0* 18.3* 16.8*  ?NEUTROABS 11.0*  --  9.6*  --  11.0*  ?HGB 15.4* 13.3 13.1 10.4* 10.1*  ?HCT 47.3* 40.1 39.3 31.6* 30.1*  ?MCV 86.2 86.8 85.6 87.3 87.0  ?PLT 613* 476* 512* 430* 423*  ? ?Basic Metabolic Panel: ?Recent Labs  ?Lab 11/25/21 ?1804 11/26/21 ?0440 11/28/21 ?0105  ?NA 140 141 136  ?K 3.7 3.6 3.9  ?CL 104 109 107  ?CO2 '22 25 23  '$ ?GLUCOSE 104* 104* 122*  ?BUN 30* 29* 24*  ?CREATININE 1.11* 0.86 0.91  ?CALCIUM 10.1 8.6* 8.0*  ? ? ? ?Recent Results (from the past 240 hour(s))  ?Surgical PCR screen     Status: None  ? Collection Time: 11/26/21 11:18 PM  ? Specimen: Nasal Mucosa; Nasal Swab  ?Result Value Ref Range Status  ? MRSA, PCR NEGATIVE NEGATIVE Final  ? Staphylococcus aureus NEGATIVE NEGATIVE Final  ?  Comment: (NOTE) ?The Xpert SA Assay (FDA approved for NASAL specimens in patients 9 ?years of age and older), is one component of a comprehensive ?surveillance program. It is not intended to diagnose infection nor to ?guide or monitor treatment. ?Performed at [facility], [address] [place], [place] ?[address] ?  ?  ? ?Radiology Studies: ?No results found. ? ?Scheduled Meds: ? acetaminophen  1,000 mg Oral Q8H  ? aspirin EC  81 mg Oral BID  ? calcium-vitamin D  1 tablet Oral Daily  ? celecoxib  100 mg Oral BID  ? cholecalciferol  1,000 Units Oral Daily  ? midazolam  1 mg Intravenous Once  ? multivitamin with minerals  1 tablet Oral Daily  ? mupirocin ointment  1 application. Nasal BID  ? ?Continuous Infusions: ? ? LOS: 4 days  ? ?Roxan Hockey, MD ?[facility] ?P4/15/2023, 7:49 PM   ?

## 2021-11-29 NOTE — [place] FL2 (Signed)
?[place] MEDICAID FL2 LEVEL OF CARE SCREENING TOOL  ?  ? ?IDENTIFICATION  ?Patient Name: ?Sheila Goodwin Birthdate: Feb 22, 1956 Sex: female Admission Date (Current Location): ?11/25/2021  ?[place] and [place] Number: ? [place] ?  Facility and Address:  ?[facility], [place] [address], [place], [place] [address] ?     Provider Number: ?[id number]  ?Attending Physician Name and Address:  ?Roxan Hockey, MD ? Relative Name and Phone Number:  ?Correa,Vincent (Spouse)   [phone] ?   ?Current Level of Care: ?Hospital Recommended Level of Care: ?[place] Prior Approval Number: ?  ? ?Date Approved/Denied: ?  PASRR Number: ?[id number] ? ?Discharge Plan: ?SNF ?  ? ?Current Diagnoses: ?Patient Active Problem List  ? Diagnosis Date Noted  ? ABLA (acute blood loss anemia) 11/28/2021  ? Leucocytosis 11/28/2021  ? Asthma, chronic 11/26/2021  ? Closed right hip fracture ([place]) 11/25/2021  ? ? ?Orientation RESPIRATION BLADDER Height & Weight   ?  ?Self, Time, Situation ? Normal External catheter Weight: 214 lb 11.7 oz (97.4 kg) ?Height:  '5\' 5"'$  (165.1 cm)  ?BEHAVIORAL SYMPTOMS/MOOD NEUROLOGICAL BOWEL NUTRITION STATUS  ?    Continent Diet (see dc summary)  ?AMBULATORY STATUS COMMUNICATION OF NEEDS Skin   ?Total Care Verbally Surgical wounds (Hip, 11/27/21) ?  ?  ?  ?    ?     ?     ? ? ?Personal Care Assistance Level of Assistance  ?Bathing, Feeding, Dressing, Total care Bathing Assistance: Maximum assistance ?Feeding assistance: Maximum assistance ?Dressing Assistance: Maximum assistance ?Total Care Assistance: Maximum assistance  ? ?Functional Limitations Info  ?Sight, Hearing, Speech Sight Info: Adequate ?Hearing Info: Impaired ?Speech Info: Adequate  ? ? ?SPECIAL CARE FACTORS FREQUENCY  ?PT (By licensed PT), OT (By licensed OT)   ?  ?PT Frequency: 5x week ?OT Frequency: 5x week ?  ?  ?  ?   ? ? ?Contractures Contractures Info: Not present  ? ? ?Additional Factors Info  ?Code  Status, Allergies Code Status Info: Full ?Allergies Info: milk protien ?  ?  ?  ?   ? ?Current Medications (11/29/2021):  This is the current hospital active medication list ?Current Facility-Administered Medications  ?Medication Dose Route Frequency Provider Last Rate Last Admin  ? acetaminophen (TYLENOL) tablet 650 mg  650 mg Oral Q4H PRN Willaim Sheng, MD   650 mg at 11/26/21 1346  ? Or  ? acetaminophen (TYLENOL) suppository 650 mg  650 mg Rectal Q4H PRN Willaim Sheng, MD      ? acetaminophen (TYLENOL) tablet 1,000 mg  1,000 mg Oral Q8H Willaim Sheng, MD   1,000 mg at 11/29/21 4259  ? albuterol (PROVENTIL) (2.5 MG/3ML) 0.083% nebulizer solution 3 mL  3 mL Inhalation Q4H PRN Willaim Sheng, MD      ? aspirin EC tablet 81 mg  81 mg Oral BID Willaim Sheng, MD   81 mg at 11/29/21 5638  ? calcium-vitamin D (OSCAL WITH D) 500-5 MG-MCG per tablet 1 tablet  1 tablet Oral Daily Willaim Sheng, MD   1 tablet at 11/29/21 7564  ? celecoxib (CELEBREX) capsule 100 mg  100 mg Oral BID Willaim Sheng, MD   100 mg at 11/29/21 3329  ? cholecalciferol (VITAMIN D3) tablet 1,000 Units  1,000 Units Oral Daily Willaim Sheng, MD   1,000 Units at 11/29/21 5188  ? ipratropium-albuterol (DUONEB) 0.5-2.5 (3) MG/3ML nebulizer solution 3 mL  3 mL Nebulization Q6H  PRN Willaim Sheng, MD      ? magnesium hydroxide (MILK OF MAGNESIA) suspension 30 mL  30 mL Oral Daily PRN Willaim Sheng, MD   30 mL at 11/26/21 1346  ? midazolam (VERSED) injection 1 mg  1 mg Intravenous Once Willaim Sheng, MD      ? morphine (PF) 2 MG/ML injection 1-2 mg  1-2 mg Intravenous Q2H PRN Willaim Sheng, MD   2 mg at 11/28/21 1135  ? multivitamin with minerals tablet 1 tablet  1 tablet Oral Daily Willaim Sheng, MD   1 tablet at 11/29/21 4097  ? mupirocin ointment (BACTROBAN) 2 % 1 application.  1 application. Nasal BID Willaim Sheng, MD   1 application. at 11/29/21 0925  ?  ondansetron (ZOFRAN) injection 4 mg  4 mg Intravenous Q4H PRN Willaim Sheng, MD   4 mg at 11/27/21 0518  ? oxyCODONE (Oxy IR/ROXICODONE) immediate release tablet 5-10 mg  5-10 mg Oral Q4H PRN Willaim Sheng, MD   10 mg at 11/28/21 2105  ? traZODone (DESYREL) tablet 25 mg  25 mg Oral QHS PRN Willaim Sheng, MD      ? ? ? ?Discharge Medications: ?Please see discharge summary for a list of discharge medications. ? ?Relevant Imaging Results: ? ?Relevant Lab Results: ? ? ?Additional Information ?SSN [id number] ? ?Briani Maul B Shaune Malacara, LCSWA ? ? ? ? ?

## 2021-11-29 NOTE — Progress Notes (Addendum)
Physical Therapy Treatment ?Patient Details ?Name: Sheila Goodwin ?MRN: [id number] ?DOB: 03-29-1956 ?Today's Date: 11/29/2021 ? ? ?History of Present Illness Sheila Goodwin is a 66 y.o. female presenting to [facility] after a fall resulting in a proximal femur fx. Pt is s/p R THA posterior approach. PMH includes asthma. ? ?  ?PT Comments  ? ? PTA re-enter room to assist pt further with mobility including transfer and gait training as no nursing staff available within 5 minutes to assist pt with transfer from [facility]. Session emphasis on gait progression and transfer training, pt reports she was not able to have BM on BSC but passing gas. Pt needing minA with mod cues for sit<>stand transfers from BSC/recliner and for short gait distances at bedside. Distance limited due to pt fatigue and c/o "hot flash" which she reports having "intermittently" still, HR/SpO2 WFL, pt agreeable to sit up in recliner at least 1 hour at end of session, chair alarm on for safety due to high fall risk. Pt continues to benefit from PT services to progress toward functional mobility goals.    ?Recommendations for follow up therapy are one component of a multi-disciplinary discharge planning process, led by the attending physician.  Recommendations may be updated based on patient status, additional functional criteria and insurance authorization. ? ?Follow Up Recommendations ? Skilled nursing-short term rehab (<3 hours/day) ?  ?  ?Assistance Recommended at Discharge Frequent or constant Supervision/Assistance  ?Patient can return home with the following A lot of help with bathing/dressing/bathroom;Assistance with cooking/housework;Assist for transportation;Help with stairs or ramp for entrance;A lot of help with walking and/or transfers ?  ?Equipment Recommendations ? Rolling walker (2 wheels)  ?  ?Recommendations for Other Services   ? ? ?  ?Precautions / Restrictions Precautions ?Precautions: Posterior Hip;Fall ?Precaution Booklet Issued: Yes  (comment) ?Precaution Comments: verbally reviewed and demonstrated, pt recalls 2/3 with cues, handout given to reinforce ?Restrictions ?Weight Bearing Restrictions: Yes ?RLE Weight Bearing: Weight bearing as tolerated  ?  ? ?Mobility ? Bed Mobility ?Overal bed mobility: Needs Assistance ?Bed Mobility: Supine to Sit ?  ?  ?Supine to sit: Min assist, HOB elevated ?Sit to supine: Mod assist ?  ?General bed mobility comments: received on BSC, remained on recliner ?  ? ?Transfers ?Overall transfer level: Needs assistance ?Equipment used: Rolling walker (2 wheels) ?Transfers: Sit to/from Stand ?Sit to Stand: Min assist, From elevated surface ?  ?Step pivot transfers: Min assist, From elevated surface ?  ?  ?  ?General transfer comment: from BSC>RW and RW<>recliner (with pillow on it for slightly higher position) ?  ? ?Ambulation/Gait ?Ambulation/Gait assistance: Min assist ?Gait Distance (Feet): 15 Feet (65f, seated break, 167f ?Assistive device: Rolling walker (2 wheels) ?Gait Pattern/deviations: Step-to pattern, Antalgic, Trunk flexed, Shuffle, Decreased stance time - right ?Gait velocity: decreased ?  ?  ?General Gait Details: cues for posture, RW and step sequencing, pursed-lip breathing and precautions, pt at times attempting to "shimmy" RLE via heel/toe pivots and cued NOT to do this due to posterior hip precs/no hip IR, will likely need reinforcement. Pt able to offload RLE with heavy reliance on BUE support of RW and max cues for step sequencing/RLE "toe out" prior to turning RW to maintain precs properly. Pt tachy to 116 bpm and SpO2 WFL, pt c/o "hot flash" and distance limited due to fatigue/sx ? ? ? ? ?  ?Balance Overall balance assessment: Needs assistance ?  ?Sitting balance-Leahy Scale: Good ?Sitting balance - Comments: able to sit unsupported at EOB ?  ?  Standing balance support: Bilateral upper extremity supported, During functional activity, Reliant on assistive device for balance ?Standing balance-Leahy  Scale: Poor ?Standing balance comment: minA for steadying/power up from elevated surfaces with RW support ?  ?  ? ?  ?Cognition Arousal/Alertness: Awake/alert ?Behavior During Therapy: Anxious ?Overall Cognitive Status: Within Functional Limits for tasks assessed ?  ?  ?  ?  ?  ?  ?  ?  ?  ?  ?  ?  ?  ?  ?  ?  ?General Comments: Anxious but participatory with 1 and sometimes 2-step instructions; pt needs reminders for precs especially no hip IR during mobility/when turning ?  ?  ? ?  ?Exercises Other Exercises ?Other Exercises: supine RLE AAROM: heel slides, hip abduction (using plastic bag for ease of ab/adduction with precs in mind), quad sets (tcs needed for technique) x10 reps ea (AROM on LLE), pt able to perform SAQ and ankle pumsp with AROM x10 reps ea ? ?  ?General Comments General comments (skin integrity, edema, etc.): VSS on RA, mild tachy (116 bpm) with exertion/pain ?  ?  ? ?Pertinent Vitals/Pain Pain Assessment ?Pain Assessment: Faces ?Faces Pain Scale: Hurts even more ?Pain Location: R hip operative site with WB/gait ?Pain Descriptors / Indicators: Discomfort, Grimacing, Guarding ?Pain Intervention(s): Monitored during session, Repositioned, Ice applied  ? ? ? ?PT Goals (current goals can now be found in the care plan section) Acute Rehab PT Goals ?Patient Stated Goal: to go to rehab and learn my exercises so I can get stronger ?PT Goal Formulation: With patient ?Time For Goal Achievement: 12/12/21 ?Progress towards PT goals: Progressing toward goals ? ?  ?Frequency ? ? ? Min 3X/week ? ? ? ?  ?PT Plan Current plan remains appropriate  ? ? ?   ?AM-PAC PT "6 Clicks" Mobility   ?Outcome Measure ? Help needed turning from your back to your side while in a flat bed without using bedrails?: A Lot ?Help needed moving from lying on your back to sitting on the side of a flat bed without using bedrails?: A Lot ?Help needed moving to and from a bed to a chair (including a wheelchair)?: A Lot (mod cues) ?Help  needed standing up from a chair using your arms (e.g., wheelchair or bedside chair)?: A Lot (mod cues) ?Help needed to walk in hospital room?: A Lot (mod cues) ?Help needed climbing 3-5 steps with a railing? : Total ?6 Click Score: 11 ? ?  ?End of Session Equipment Utilized During Treatment: Gait belt ?Activity Tolerance: Patient tolerated treatment well ?Patient left: in chair;with call bell/phone within reach;with chair alarm set (ice to R hip) ?Nurse Communication: Mobility status ?PT Visit Diagnosis: Other abnormalities of gait and mobility (R26.89);Muscle weakness (generalized) (M62.81);Unsteadiness on feet (R26.81);Pain ?Pain - Right/Left: Right ?Pain - part of body: Hip ?  ? ? ?Time: 2956-2130 ?PT Time Calculation (min) (ACUTE ONLY): 20 min ? ?Charges:  $Gait Training: 8-22 mins         ?          ? ?Aleecia Tapia P., PTA ?Acute Rehabilitation Services ?Secure Chat Preferred 9a-5:30pm ?Office: [phone]  ? ? ?Griselda Tosh M Jakevious Hollister ?11/29/2021, 12:34 PM ? ?

## 2021-11-29 NOTE — Progress Notes (Signed)
Subjective: ?2 Days Post-Op Procedure(s) (LRB): ?TOTAL HIP ARTHROPLASTY POSTERIOR (Right) ?Patient resting comfortably.   ? ?Objective: ?Vital signs in last 24 hours: ?Temp:  [98 ?F (36.7 ?C)-98.7 ?F (37.1 ?C)] 98 ?F (36.7 ?C) (04/15 0719) ?Pulse Rate:  [82-89] 89 (04/15 0719) ?Resp:  [14-18] 16 (04/15 0719) ?BP: (115-125)/(52-62) 121/62 (04/15 0719) ?SpO2:  [92 %-99 %] 98 % (04/15 0719) ? ?Intake/Output from previous day: ?04/14 0701 - 04/15 0700 ?In: 480 [P.O.:480] ?Out: 750 [Urine:750] ?Intake/Output this shift: ?No intake/output data recorded. ? ?Recent Labs  ?  11/27/21 ?0120 11/28/21 ?0105 11/29/21 ?7416  ?HGB 13.1 10.4* 10.1*  ? ?Recent Labs  ?  11/28/21 ?0105 11/29/21 ?3845  ?WBC 18.3* 16.8*  ?RBC 3.62* 3.46*  ?HCT 31.6* 30.1*  ?PLT 430* 423*  ? ?Recent Labs  ?  11/28/21 ?0105  ?NA 136  ?K 3.9  ?CL 107  ?CO2 23  ?BUN 24*  ?CREATININE 0.91  ?GLUCOSE 122*  ?CALCIUM 8.0*  ? ?No results for input(s): LABPT, INR in the last 72 hours. ? ? ? ? ?Assessment/Plan: ?2 Days Post-Op Procedure(s) (LRB): ?TOTAL HIP ARTHROPLASTY POSTERIOR (Right) ?Up with therapy ?PT/OT ?WBAT ?ASA dvt proph ?Pain control as ordered ?Dispo likely snf ? ? ? ? ? ?Sheila Goodwin ?11/29/2021, 8:35 AM ? ?

## 2021-11-29 NOTE — Progress Notes (Signed)
Physical Therapy Treatment ?Patient Details ?Name: Sheila Goodwin ?MRN: [id number] ?DOB: 09-26-1955 ?Today's Date: 11/29/2021 ? ? ?History of Present Illness Sheila Goodwin is a 66 y.o. female presenting to [facility] after a fall resulting in a proximal femur fx. Pt is s/p R THA posterior approach. PMH includes asthma. ? ?  ?PT Comments  ? ? Pt received in supine, anxious but agreeable to therapy session and with good participation and tolerance for exercise and transfer training. Pt performed supine LE therapeutic exercise instruction with AA needed for increased ROM and compliance with posterior hip precs and minA needed for bed mobility and transfers from elevated head of bed and bed heights with RW. Pt needing increased time/cues for proper body mechanics during transfers and stepping to [facility] Pt requesting increased time to use [facility], RN notified pt A&O will call for assist once done. Pt continues to benefit from PT services to progress toward functional mobility goals.    ?Recommendations for follow up therapy are one component of a multi-disciplinary discharge planning process, led by the attending physician.  Recommendations may be updated based on patient status, additional functional criteria and insurance authorization. ? ?Follow Up Recommendations ? Skilled nursing-short term rehab (<3 hours/day) ?  ?  ?Assistance Recommended at Discharge Frequent or constant Supervision/Assistance  ?Patient can return home with the following Two people to help with walking and/or transfers;A lot of help with bathing/dressing/bathroom;Assistance with cooking/housework;Assist for transportation;Help with stairs or ramp for entrance ?  ?Equipment Recommendations ? Rolling walker (2 wheels)  ?  ?Recommendations for Other Services   ? ? ?  ?Precautions / Restrictions Precautions ?Precautions: Posterior Hip;Fall ?Precaution Booklet Issued: Yes (comment) ?Precaution Comments: verbally reviewed and demonstrated, pt recalls 2/3 with cues,  handout given to reinforce ?Restrictions ?Weight Bearing Restrictions: Yes ?RLE Weight Bearing: Weight bearing as tolerated  ?  ? ?Mobility ? Bed Mobility ?Overal bed mobility: Needs Assistance ?Bed Mobility: Supine to Sit ?  ?  ?Supine to sit: Min assist, HOB elevated ?Sit to supine: Mod assist ?  ?General bed mobility comments: assist for BLE mgmt to maintain precs, pt using plastic bag under RLE for ease of positioning within precs, HOB very elevated and pt to long sit at EOB prior to rotating hips around due to lack of +2 assist for trunk support; compliant with no bending past 90 with cues and bed pad assist ?  ? ?Transfers ?Overall transfer level: Needs assistance ?Equipment used: Rolling walker (2 wheels) ?Transfers: Sit to/from Stand ?Sit to Stand: Min assist, From elevated surface ?  ?Step pivot transfers: Min assist, From elevated surface ?  ?  ?  ?General transfer comment: from elevated bed>RW with minA and mod cues for body mechanics/positioning and no bending prec, then to [facility] from RW ?  ? ? ?  ?Balance Overall balance assessment: Needs assistance ?  ?Sitting balance-Leahy Scale: Good ?Sitting balance - Comments: able to sit unsupported at EOB ?  ?Standing balance support: Bilateral upper extremity supported, During functional activity, Reliant on assistive device for balance ?Standing balance-Leahy Scale: Poor ?Standing balance comment: minA for steadying/power up from elevated surfaces with RW support ?  ?  ?  ?  ?  ?  ?  ?  ?  ?  ?  ?  ? ?  ?Cognition Arousal/Alertness: Awake/alert ?Behavior During Therapy: Anxious ?Overall Cognitive Status: Within Functional Limits for tasks assessed ?  ?  ?  ?  ?  ?  ?  ?General Comments: Improved initiation but remains  anxious to attempt and very detail oriented; needs some redirection to task. ?  ?  ? ?  ?Exercises Other Exercises ?Other Exercises: supine RLE AAROM: heel slides, hip abduction (using plastic bag for ease of ab/adduction with precs in mind), quad  sets (tcs needed for technique) x10 reps ea (AROM on LLE), pt able to perform SAQ and ankle pumsp with AROM x10 reps ea ? ?  ?General Comments General comments (skin integrity, edema, etc.): SpO2 96% on RA and HR 100 bpm seated on BSC post-exertion ?  ?  ? ?Pertinent Vitals/Pain Pain Assessment ?Pain Assessment: Faces ?Faces Pain Scale: Hurts even more ?Pain Location: R hip operative site with WB/gait ?Pain Descriptors / Indicators: Discomfort, Grimacing, Guarding ?Pain Intervention(s): Monitored during session, Premedicated before session, Ice applied, Repositioned (c/o "feeling hot", pt reports hot flashes with stress still)  ? ? ?Home Living   ?Prior Function   ? ?PT Goals (current goals can now be found in the care plan section) Acute Rehab PT Goals ?Patient Stated Goal: to go to rehab and learn my exercises so I can get stronger ?PT Goal Formulation: With patient ?Time For Goal Achievement: 12/12/21 ?Progress towards PT goals: Progressing toward goals ? ?  ?Frequency ? ? ? Min 3X/week ? ? ? ?  ?PT Plan Current plan remains appropriate  ? ? ?   ?AM-PAC PT "6 Clicks" Mobility   ?Outcome Measure ? Help needed turning from your back to your side while in a flat bed without using bedrails?: A Lot ?Help needed moving from lying on your back to sitting on the side of a flat bed without using bedrails?: A Lot ?Help needed moving to and from a bed to a chair (including a wheelchair)?: A Lot (mod cues) ?Help needed standing up from a chair using your arms (e.g., wheelchair or bedside chair)?: A Lot (mod cues) ?Help needed to walk in hospital room?: A Lot (mod cues) ?Help needed climbing 3-5 steps with a railing? : Total ?6 Click Score: 11 ? ?  ?End of Session Equipment Utilized During Treatment: Gait belt ?Activity Tolerance: Patient tolerated treatment well ?Patient left: in chair;with call bell/phone within reach;Other (comment) (pt on BSC, aware of need to use call bell to get up) ?Nurse Communication: Mobility  status;Other (comment) (pt on [facility]) ?PT Visit Diagnosis: Other abnormalities of gait and mobility (R26.89);Muscle weakness (generalized) (M62.81);Unsteadiness on feet (R26.81);Pain ?Pain - Right/Left: Right ?Pain - part of body: Hip ?  ? ? ?Time: 7414-2395 ?PT Time Calculation (min) (ACUTE ONLY): 27 min ? ?Charges:  $Therapeutic Exercise: 8-22 mins ?$Therapeutic Activity: 8-22 mins          ?          ? ?Zakira Ressel P., PTA ?Acute Rehabilitation Services ?Secure Chat Preferred 9a-5:30pm ?Office: [phone]  ? ? ?Aiko Belko M Lenda Baratta ?11/29/2021, 12:21 PM ? ?

## 2021-11-30 DIAGNOSIS — W19XXXA Unspecified fall, initial encounter: Secondary | ICD-10-CM | POA: Diagnosis not present

## 2021-11-30 DIAGNOSIS — D62 Acute posthemorrhagic anemia: Secondary | ICD-10-CM | POA: Diagnosis not present

## 2021-11-30 DIAGNOSIS — S72001A Fracture of unspecified part of neck of right femur, initial encounter for closed fracture: Secondary | ICD-10-CM | POA: Diagnosis not present

## 2021-11-30 DIAGNOSIS — J4520 Mild intermittent asthma, uncomplicated: Secondary | ICD-10-CM | POA: Diagnosis not present

## 2021-11-30 LAB — CBC
HCT: 29.8 % — ABNORMAL LOW (ref 36.0–46.0)
Hemoglobin: 10 g/dL — ABNORMAL LOW (ref 12.0–15.0)
MCH: 28.9 pg (ref 26.0–34.0)
MCHC: 33.6 g/dL (ref 30.0–36.0)
MCV: 86.1 fL (ref 80.0–100.0)
Platelets: 453 10*3/uL — ABNORMAL HIGH (ref 150–400)
RBC: 3.46 MIL/uL — ABNORMAL LOW (ref 3.87–5.11)
RDW: 14 % (ref 11.5–15.5)
WBC: 16.9 10*3/uL — ABNORMAL HIGH (ref 4.0–10.5)
nRBC: 0 % (ref 0.0–0.2)

## 2021-11-30 NOTE — Progress Notes (Signed)
? ? ? [facility] ?                                                                            ? ? ?Sheila Goodwin, is a 66 y.o. female, DOB - 1956/08/03, GQB:[id number] ?Admit date - 11/25/2021    ?Outpatient Primary MD for the patient is London Pepper, MD ? ?LOS - 5  days ? ? ? ?Brief summary  ? ? ?Patient is a 66 year old female with history of asthma who presented after accidental mechanical fall at home while she was working with curtain rod.  After the fall, significant low back pain, right hip pain.  No history of head injury.  On presentation she was hemodynamically stable.  Imaging showed displaced right femoral neck fracture, possible fracture of sacrococcygeal joint.  Orthopedics consulted.  Underwent total hip arthroplasty on 4/13.  PT recommended skilled nursing facility on discharge. ?11/30/21--patient remains medically stable for transfer to SNF rehab when bed available ? ?Assessment & Plan  ? ? ?Assessment and Plan: ?* Closed right hip fracture ([place]), mechanical fall ?-Imaging showed displaced right femoral neck fracture ?-Orthopedics was consulted, underwent total hip arthroplasty on 4/13 ?-Per orthopedics, started on aspirin 81 mg twice daily for DVT prophylaxis ?-PT recommended SNF, stable for discharge when bed available ?-Per orthopedics,WBAT RLE, posterior hip precautions x6 weeks and outpatient follow-up 2 weeks after surgery with Dr.Marchwiany at [place] ? ?Asthma, chronic ?- Currently stable, no wheezing ? ?Back pain ?Imaging also showed possible fracture of sacrococcygeal joint, continue supportive management ? ?Leukocytosis ?-Likely reactive, stable and trending down.  UA not consistent with frank UTI ? ?Obesity ?Estimated body mass index is 35.73 kg/m? as calculated from the following: ?  Height as of this encounter: '5\' 5"'$  (1.651 m). ?  Weight as of this encounter: 97.4 kg. ? ?Code Status: Full code ?DVT Prophylaxis:  SCDs Start: 11/25/21 2141 ? ? ?Level of Care:  Level of care: Med-Surg ?Family Communication: Alert and oriented.  No family at the bedside ? ?Disposition Plan:     Remains inpatient appropriate: Medically stable for discharge, pending SNF ? ?Procedures:  ?Total hip arthroplasty posterior, right ? ?Consultants:   ?Orthopedics ? ?Antimicrobials:  ?None ? ?Medications ? ? acetaminophen  1,000 mg Oral Q8H  ? aspirin EC  81 mg Oral BID  ? calcium-vitamin D  1 tablet Oral Daily  ? celecoxib  100 mg Oral BID  ? cholecalciferol  1,000 Units Oral Daily  ? midazolam  1 mg Intravenous Once  ? multivitamin with minerals  1 tablet Oral Daily  ? mupirocin ointment  1 application. Nasal BID  ? ? ? ?Subjective:  ? ?[name] was seen and examined today.  Somewhat uncomfortable, was on bedside commode and needed to go to the bed.  No acute chest pain, shortness of breath, abdominal pain nausea vomiting.  ? ?Objective:  ? ?Vitals:  ? 11/29/21 1539 11/29/21 2034 11/30/21 8280 11/30/21 0859  ?BP: 94/68 (!) 127/54 134/66 130/72  ?Pulse: 93 93 89 90  ?Resp: '16 18 18 19  '$ ?Temp: 97.8 ?F (36.6 ?C) 98.1 ?F (36.7 ?C) 98.3 ?F (36.8 ?C) (!) 97.3 ?F (36.3 ?C)  ?TempSrc: Oral Oral Oral Oral  ?  SpO2: 100% 100% 97% 100%  ?Weight:      ?Height:      ? ? ?Intake/Output Summary (Last 24 hours) at 11/30/2021 1220 ?Last data filed at 11/29/2021 2015 ?Gross per 24 hour  ?Intake 240 ml  ?Output 400 ml  ?Net -160 ml  ? ?Filed Weights  ? 11/26/21 2217  ?Weight: 97.4 kg  ? ? ? ?Exam ?General: Alert and oriented x 3, NAD ?Cardiovascular: S1 S2 auscultated, RRR ?Respiratory: Clear to auscultation bilaterally ?Gastrointestinal: Soft, nontender, nondistended, + bowel sounds ?Ext: no pedal edema bilaterally ?Neuro: moving all 4 extremities spontaneously ?Psych: Normal affect and demeanor, alert and oriented x3  ? ? ?Data Reviewed:  I have personally reviewed following labs  ? ? ?CBC ?Lab Results  ?Component Value Date  ? WBC 16.9 (H) 11/30/2021  ? RBC 3.46 (L) 11/30/2021  ? HGB 10.0 (L) 11/30/2021  ? HCT  29.8 (L) 11/30/2021  ? MCV 86.1 11/30/2021  ? MCH 28.9 11/30/2021  ? PLT 453 (H) 11/30/2021  ? MCHC 33.6 11/30/2021  ? RDW 14.0 11/30/2021  ? LYMPHSABS 3.3 11/29/2021  ? MONOABS 2.2 (H) 11/29/2021  ? EOSABS 0.2 11/29/2021  ? BASOSABS 0.1 11/29/2021  ? ? ? ?Last metabolic panel ?Lab Results  ?Component Value Date  ? NA 136 11/28/2021  ? K 3.9 11/28/2021  ? CL 107 11/28/2021  ? CO2 23 11/28/2021  ? BUN 24 (H) 11/28/2021  ? CREATININE 0.91 11/28/2021  ? GLUCOSE 122 (H) 11/28/2021  ? GFRNONAA >60 11/28/2021  ? GFRAA >60 02/21/2018  ? CALCIUM 8.0 (L) 11/28/2021  ? PROT 7.8 11/25/2021  ? ALBUMIN 4.1 11/25/2021  ? BILITOT 1.3 (H) 11/25/2021  ? ALKPHOS 88 11/25/2021  ? AST 33 11/25/2021  ? ALT 18 11/25/2021  ? ANIONGAP 6 11/28/2021  ? ? ? ? ?Coagulation Profile: ?Recent Labs  ?Lab 11/25/21 ?8250  ?INR 1.1  ? ? ? ? ? ? ? ? ?Estill Cotta M.D. ?[facility] ?11/30/2021, 12:20 PM ? ?Available via Epic secure chat 7am-7pm ?After 7 pm, please refer to night coverage provider listed on amion. ? ?  ?

## 2021-11-30 NOTE — Progress Notes (Signed)
? ? ? ?  Subjective: ? ?Patient reports pain as moderate today.  She worked very well with physical therapy.  Ambulated twice.  Set up in the chair for 4 hours.  She states she was very sore after sitting for the chair for that long and feels like she overdid it.  Recommend continued mobilization multiple times a day with frequent rest and breaks.  She denies numbness and tingling.  No other issues. ? ?Objective:  ? ?VITALS:   ?Vitals:  ? 11/29/21 1100 11/29/21 1539 11/29/21 2034 11/30/21 0638  ?BP:  94/68 (!) 127/54 134/66  ?Pulse:  93 93 89  ?Resp:  '16 18 18  '$ ?Temp:  97.8 ?F (36.6 ?C) 98.1 ?F (36.7 ?C) 98.3 ?F (36.8 ?C)  ?TempSrc:  Oral Oral Oral  ?SpO2: 96% 100% 100% 97%  ?Weight:      ?Height:      ? ? ?Sensation intact distally ?Intact pulses distally ?Dorsiflexion/Plantar flexion intact ?Incision: dressing C/D/I ?No cellulitis present ?Compartment soft ? ? ?Lab Results  ?Component Value Date  ? WBC 16.9 (H) 11/30/2021  ? HGB 10.0 (L) 11/30/2021  ? HCT 29.8 (L) 11/30/2021  ? MCV 86.1 11/30/2021  ? PLT 453 (H) 11/30/2021  ? ?BMET ?   ?Component Value Date/Time  ? NA 136 11/28/2021 0105  ? K 3.9 11/28/2021 0105  ? CL 107 11/28/2021 0105  ? CO2 23 11/28/2021 0105  ? GLUCOSE 122 (H) 11/28/2021 0105  ? BUN 24 (H) 11/28/2021 0105  ? CREATININE 0.91 11/28/2021 0105  ? CREATININE 0.96 02/21/2018 1259  ? CALCIUM 8.0 (L) 11/28/2021 0105  ? GFRNONAA >60 11/28/2021 0105  ? GFRNONAA >60 02/21/2018 1259  ? ? ? ? ?Xray: Postop x-rays demonstrate total hip arthroplasty in excellent position, no fracture ? ?Assessment/Plan: ?3 Days Post-Op  ? ?Principal Problem: ?  Closed right hip fracture ([place]) ?Active Problems: ?  Asthma, chronic ?  ABLA (acute blood loss anemia) ?  Leucocytosis ? ?Right total hip arthroplasty for femoral neck fracture 11/28/2021 ? ?Post op recs: ?WB: WBAT RLE, posterior hip precautions x6 weeks ?Abx: ancef x23 hours post op ?Imaging: PACU pelvis Xray ?Dressing: Aquacell, keep intact until follow up ?DVT  prophylaxis: Aspirin 81BID starting POD1 ?Follow up: 2 weeks after surgery for a wound check with Dr. Zachery Dakins at [facility].  ?Address: [address] [place], [place], [place] [address]  ?Office Phone: [phone] ? ? ? ?Sheila Goodwin ?11/30/2021, 8:41 AM ? ? ?Charlies Constable, MD ? ?Contact information:   ?Weekdays 7am-5pm epic message Dr. Zachery Dakins, or call office for patient follow up: [phone] ?After hours and holidays please check [email or web address] for group call information for [facility] ? ?  ?

## 2021-11-30 NOTE — Progress Notes (Signed)
Mobility Specialist: Progress Note ? ? 11/30/21 1048  ?Mobility  ?Activity Refused mobility  ? ?Pt refused mobility with c/o pain in her RLE as well as stating she hasn't ate yet today. Will f/u as able.  ? ?Sheila Goodwin ?Mobility Specialist ?Mobility Specialist [place]: [phone] ?Mobility Specialist [place]: [phone] ? ?

## 2021-12-01 DIAGNOSIS — S72001A Fracture of unspecified part of neck of right femur, initial encounter for closed fracture: Secondary | ICD-10-CM | POA: Diagnosis not present

## 2021-12-01 MED ORDER — LOPERAMIDE HCL 2 MG PO CAPS
2.0000 mg | ORAL_CAPSULE | Freq: Three times a day (TID) | ORAL | Status: DC | PRN
  Administered 2021-12-01: 2 mg via ORAL
  Filled 2021-12-01: qty 1

## 2021-12-01 MED ORDER — OXYCODONE HCL 5 MG PO TABS: 5.0000 mg | ORAL_TABLET | ORAL | 0 refills | Status: AC | PRN

## 2021-12-01 MED ORDER — ASPIRIN 81 MG PO TBEC: 81.0000 mg | DELAYED_RELEASE_TABLET | Freq: Two times a day (BID) | ORAL | 0 refills | Status: AC

## 2021-12-01 NOTE — Progress Notes (Signed)
Physical Therapy Treatment ?Patient Details ?Name: Sheila Goodwin ?MRN: [id number] ?DOB: [birth year] ?Today's Date: 12/01/2021 ? ? ?History of Present Illness Sheila Goodwin is a 66 y.o. female presenting to [facility] after a fall resulting in a proximal femur fx. Pt is s/p R THA posterior approach. PMH includes asthma. ? ?  ?PT Comments  ? ? Pt progressing towards physical therapy goals. Somewhat self limiting at times and easily distracted from task. VC's throughout for maintenance of posterior hip precautions. SNF level rehab remains appropriate. Will continue to follow.  ?  ?Recommendations for follow up therapy are one component of a multi-disciplinary discharge planning process, led by the attending physician.  Recommendations may be updated based on patient status, additional functional criteria and insurance authorization. ? ?Follow Up Recommendations ? Skilled nursing-short term rehab (<3 hours/day) ?  ?  ?Assistance Recommended at Discharge Frequent or constant Supervision/Assistance  ?Patient can return home with the following Assistance with cooking/housework;Assist for transportation;Help with stairs or ramp for entrance;A lot of help with walking and/or transfers ?  ?Equipment Recommendations ? Rolling walker (2 wheels)  ?  ?Recommendations for Other Services   ? ? ?  ?Precautions / Restrictions Precautions ?Precautions: Posterior Hip;Fall ?Precaution Booklet Issued: Yes (comment) ?Precaution Comments: verbally reviewed and demonstrated, pt recalls 2/3 with cues, handout given to reinforce ?Restrictions ?Weight Bearing Restrictions: Yes ?RLE Weight Bearing: Weight bearing as tolerated  ?  ? ?Mobility ? Bed Mobility ?Overal bed mobility: Needs Assistance ?Bed Mobility: Supine to Sit ?  ?  ?Supine to sit: Min assist, HOB elevated ?  ?  ?General bed mobility comments: Assist for LE management. Cues throughout to maintain hip precautions. Attempted with HOB flat but unable to advance RLE towards EOB. HOB elevated to  ease transfer. ?  ? ?Transfers ?Overall transfer level: Needs assistance ?Equipment used: Rolling walker (2 wheels) ?Transfers: Sit to/from Stand ?Sit to Stand: Min guard, From elevated surface ?  ?  ?  ?  ?  ?General transfer comment: Very elevated surface to be able to achieve sit>stand without assist. VC's throughout for hand placement on seated surface for safety as well as LE positioning to maintain posterior hip precautions. ?  ? ?Ambulation/Gait ?Ambulation/Gait assistance: Min guard, +2 safety/equipment ?Gait Distance (Feet): 20 Feet (+15') ?Assistive device: Rolling walker (2 wheels) ?Gait Pattern/deviations: Step-to pattern, Antalgic, Trunk flexed, Shuffle, Decreased stance time - right ?Gait velocity: decreased ?Gait velocity interpretation: <1.31 ft/sec, indicative of household ambulator ?  ?General Gait Details: VC's for improved posture, sequencing with the RW and forward gaze. Pt saying the sequencing out loud to help herself remember. Distance limited by need to void, and pt turned around to ambulate into the bathroom. ? ? ?Stairs ?  ?  ?  ?  ?  ? ? ?Wheelchair Mobility ?  ? ?Modified Rankin (Stroke Patients Only) ?  ? ? ?  ?Balance Overall balance assessment: Needs assistance ?Sitting-balance support: Bilateral upper extremity supported, Feet supported ?Sitting balance-Leahy Scale: Good ?Sitting balance - Comments: able to sit unsupported at EOB ?  ?Standing balance support: Bilateral upper extremity supported, During functional activity, Reliant on assistive device for balance ?Standing balance-Leahy Scale: Poor ?  ?  ?  ?  ?  ?  ?  ?  ?  ?  ?  ?  ?  ? ?  ?Cognition Arousal/Alertness: Awake/alert ?Behavior During Therapy: Anxious ?Overall Cognitive Status: Within Functional Limits for tasks assessed ?  ?  ?  ?  ?  ?  ?  ?  ?  ?  ?  ?  ?  ?  ?  ?  ?  ?  ?  ? ?  ?  Exercises   ? ?  ?General Comments   ?  ?  ? ?Pertinent Vitals/Pain Pain Assessment ?Pain Assessment: 0-10 ?Pain Score: 4  ?Pain Location:  R hip operative site with WB/gait ?Pain Descriptors / Indicators: Discomfort, Grimacing, Guarding ?Pain Intervention(s): Limited activity within patient's tolerance, Monitored during session, Repositioned  ? ? ?Home Living   ?  ?  ?  ?  ?  ?  ?  ?  ?  ?   ?  ?Prior Function    ?  ?  ?   ? ?PT Goals (current goals can now be found in the care plan section) Acute Rehab PT Goals ?Patient Stated Goal: to go to rehab and learn my exercises so I can get stronger ?PT Goal Formulation: With patient ?Time For Goal Achievement: 12/12/21 ?Potential to Achieve Goals: Good ?Progress towards PT goals: Progressing toward goals ? ?  ?Frequency ? ? ? Min 3X/week ? ? ? ?  ?PT Plan Current plan remains appropriate  ? ? ?Co-evaluation   ?  ?  ?  ?  ? ?  ?AM-PAC PT "6 Clicks" Mobility   ?Outcome Measure ? Help needed turning from your back to your side while in a flat bed without using bedrails?: A Little ?Help needed moving from lying on your back to sitting on the side of a flat bed without using bedrails?: A Little ?Help needed moving to and from a bed to a chair (including a wheelchair)?: A Little ?Help needed standing up from a chair using your arms (e.g., wheelchair or bedside chair)?: A Little ?Help needed to walk in hospital room?: A Little ?Help needed climbing 3-5 steps with a railing? : Total ?6 Click Score: 16 ? ?  ?End of Session Equipment Utilized During Treatment: Gait belt ?Activity Tolerance: Patient tolerated treatment well ?Patient left: in chair;with call bell/phone within reach;with chair alarm set ?Nurse Communication: Mobility status ?PT Visit Diagnosis: Other abnormalities of gait and mobility (R26.89);Muscle weakness (generalized) (M62.81);Unsteadiness on feet (R26.81);Pain ?Pain - Right/Left: Right ?Pain - part of body: Hip ?  ? ? ?Time: 3557-3220 ?PT Time Calculation (min) (ACUTE ONLY): 38 min ? ?Charges:  $Gait Training: 23-37 mins ?$Therapeutic Activity: 8-22 mins          ?          ? ?Rolinda Roan, PT,  DPT ?Acute Rehabilitation Services ?Secure Chat Preferred ?Office: [phone]  ? ? ?Thelma Comp ?12/01/2021, 1:15 PM ? ?

## 2021-12-01 NOTE — Discharge Summary (Addendum)
Physician Discharge Summary  ?Sheila Goodwin MBT:[id number] DOB: 1955/10/08 DOA: 11/25/2021 ? ?PCP: London Pepper, MD ? ?Admit date: 11/25/2021 ?Discharge date: 12/03/2021 ? ?Admitted From: Home ?Disposition:  SNF ? ?Discharge Condition:Stable ?CODE STATUS:FULL ?Diet recommendation:  Regular   ? ?Brief/Interim Summary: ? ?Right femoral neck fracture: Presented with mechanical fall from home.  Complained of low back pain, right hip pain. ?Imaging showed displaced right femoral neck fracture.  Orthopedics consulted.  Underwent total hip arthroplasty on 4/13 ?Patient has been started on aspirin 81 mg twice daily for DVT prophylaxis, PT/OT consulted, recommended skilled nursing facility on discharge.  ?She will follow-up with orthopedics as an outpatient in 2 weeks. ?  ?Back pain: Imagings also showed possible fracture of the sacrococcygeal joint.  Currently denies any back pain continue supportive care, pain management. ?  ?History of asthma: Continue bronchodilators as needed.  Currently not in exacerbation. ?  ?Leukocytosis: Thought to be reactive.  Low suspicious for infectious etiology.  No signs of infection.  UA not suspicious for UTI.  No respiratory problems, low suspicion for pneumonia.  Surgical wound is clean.  Check CBC in a week.  If she continues to have leukocytosis, since to make an appointment with hematology as an outpatient ? ?Acute blood loss anemia: Hemoglobin dropped from 13 to the range of 10.  Likely associated with surgery.  Monitor as an outpatient ?  ?Obesity: BMI of 35.7 ? ?Discharge Diagnoses:  ?Principal Problem: ?  Closed right hip fracture ([place]) ?Active Problems: ?  Asthma, chronic ?  ABLA (acute blood loss anemia) ?  Leucocytosis ? ? ? ?Discharge Instructions ? ?Discharge Instructions   ? ? Diet general   Complete by: As directed ?  ? Discharge instructions   Complete by: As directed ?  ? 1)Please take prescribed medications as instructed ?2)Follow up with orthopedics in 2 weeks,name  and number of the provider has been attached ?3)Do a CBC test in a week  ? Increase activity slowly   Complete by: As directed ?  ? No wound care   Complete by: As directed ?  ? ?  ? ?Allergies as of 12/03/2021   ? ?   Reactions  ? Milk Protein Other (See Comments)  ? Lactose intolerant  ? ?  ? ?  ?Medication List  ?  ? ?TAKE these medications   ? ?albuterol 108 (90 Base) MCG/ACT inhaler ?Commonly known as: VENTOLIN HFA ?Inhale 2 puffs into the lungs every 4 (four) hours as needed for wheezing or shortness of breath. ?  ?aspirin 81 MG EC tablet ?Take 1 tablet (81 mg total) by mouth 2 (two) times daily for 28 days. Swallow whole. ?  ?CALCIUM PO ?Take 1 tablet by mouth daily. ?  ?diphenhydrAMINE 25 MG tablet ?Commonly known as: BENADRYL ?Take 25 mg by mouth at bedtime as needed for allergies. ?  ?ESTROVEN PO ?Take 1 tablet by mouth daily. ?  ?fexofenadine 60 MG tablet ?Commonly known as: ALLEGRA ?Take 60 mg by mouth 2 (two) times daily. ?  ?MINERAL ICE EX ?Apply 1 application. topically daily as needed (pain). ?  ?mometasone-formoterol 100-5 MCG/ACT Aero ?Commonly known as: DULERA ?Inhale 2 puffs into the lungs 2 (two) times daily. ?  ?multivitamin tablet ?Take 1 tablet by mouth daily. ?  ?naproxen sodium 220 MG tablet ?Commonly known as: ALEVE ?Take 440 mg by mouth daily as needed (pain). ?  ?OVER THE COUNTER MEDICATION ?Take 1 tablet by mouth daily. Fiber One ?  ?oxyCODONE 5 MG  immediate release tablet ?Commonly known as: Oxy IR/ROXICODONE ?Take 1 tablet (5 mg total) by mouth every 4 (four) hours as needed for severe pain or moderate pain. ?  ?PROBIOTIC PEARLS PO ?Take 1 capsule by mouth daily. ?  ? ?  ? ? Follow-up Information   ? ? Willaim Sheng, MD. Schedule an appointment as soon as possible for a visit in 2 week(s).   ?Specialty: Orthopedic Surgery ?Contact information: ?[address] ?[place] [address] ?[phone] ? ? ?  ?  ? ?  ?  ? ?  ? ?Allergies  ?Allergen Reactions  ? Milk Protein  Other (See Comments)  ?  Lactose intolerant  ? ? ?Consultations: ?Orthopedics ? ? ?Procedures/Studies: ?DG Chest 1 View ? ?Result Date: 11/25/2021 ?CLINICAL DATA:  Recent fall with right hip pain, initial encounter EXAM: CHEST  1 VIEW COMPARISON:  None. FINDINGS: The heart size and mediastinal contours are within normal limits. Both lungs are clear. The visualized skeletal structures are unremarkable. IMPRESSION: No active disease. Electronically Signed   By: Inez Catalina M.D.   On: 11/25/2021 19:08  ? ?DG Lumbar Spine Complete ? ?Result Date: 11/25/2021 ?CLINICAL DATA:  Pain after fall.  Low back pain, fall yesterday. EXAM: LUMBAR SPINE - COMPLETE 4+ VIEW COMPARISON:  None. FINDINGS: The bones are under mineralized. There are suspected 6 non-rib-bearing lumbar vertebra with lumbarization of S1. The lower most fully formed disc space will be labeled S1-S2. Possible fracture at the sacrococcygeal junction with cortical offset, although wider aeration of normal exist in this region. Grade anterolisthesis of L5 on S1 with associated degenerative disc disease and facet hypertrophy. There is moderate L4-L5 and mild L3-L4 facet hypertrophy. Moderate L1-L2 degenerative disc disease. No evidence of acute lumbar fracture. Lumbar vertebral body heights are normal. Limited assessment of the posterior elements due to under mineralization. IMPRESSION: 1. Possible fracture at the sacrococcygeal junction. Recommend correlation with point tenderness. 2. Transitional lumbosacral anatomy with lumbarized S1. Close attention to spinal numbering is recommended if intervention is planned. 3. Multilevel degenerative change with grade 1 anterolisthesis of L5 on S1. 4. Bones are under mineralized. Electronically Signed   By: Keith Rake M.D.   On: 11/25/2021 17:13  ? ?DG Tibia/Fibula Right ? ?Result Date: 11/25/2021 ?CLINICAL DATA:  Recent fall with known right femoral fracture, initial encounter EXAM: RIGHT TIBIA AND FIBULA - 2 VIEW  COMPARISON:  None. FINDINGS: There is no evidence of fracture or other focal bone lesions. Soft tissues are unremarkable. IMPRESSION: No acute abnormality noted. Electronically Signed   By: Inez Catalina M.D.   On: 11/25/2021 19:08  ? ?CT Lumbar Spine Wo Contrast ? ?Result Date: 11/25/2021 ?CLINICAL DATA:  Low back pain.  Fall EXAM: CT LUMBAR SPINE WITHOUT CONTRAST TECHNIQUE: Multidetector CT imaging of the lumbar spine was performed without intravenous contrast administration. Multiplanar CT image reconstructions were also generated. RADIATION DOSE REDUCTION: This exam was performed according to the departmental dose-optimization program which includes automated exposure control, adjustment of the mA and/or kV according to patient size and/or use of iterative reconstruction technique. COMPARISON:  Lumbar radiographs 11/25/2021 FINDINGS: Segmentation: Small ribs at T12 are not visualized on the prior x-ray. Therefore the lowest lumbarized vertebra is L5, not S1 as noted on the prior study. Grade 1 anterolisthesis is at L4-5. Five lumbar vertebra. Alignment: 6 mm anterolisthesis L4-5.  Mild retrolisthesis L3-4 Vertebrae: Negative for lumbar fracture or mass lesion. Paraspinal and other soft tissues: 4 cm right upper pole renal  cyst. Negative for paraspinous mass or adenopathy. Sigmoid diverticulosis. Disc levels: T12-L1: Mild disc degeneration and disc bulging. Negative for stenosis L1-2: Negative L2-3: Mild disc bulging and mild facet degeneration. Negative for stenosis L3-4: Mild disc bulging and moderate facet degeneration. Mild spinal stenosis. Mild subarticular stenosis bilaterally. L4-5: 6 mm anterolisthesis. Severe facet degeneration bilaterally. Moderate spinal stenosis. Negative for spinal or foraminal stenosis L5-S1: Mild facet degeneration.  Negative for stenosis. IMPRESSION: Negative for lumbar fracture Five lumbar vertebra. Lumbosacral junction is L5-S1. Grade 1 anterolisthesis L4-5 6 mm anterolisthesis  L4-5 with moderate spinal stenosis. Electronically Signed   By: Franchot Gallo M.D.   On: 11/25/2021 18:46  ? ?CT PELVIS WO CONTRAST ? ?Result Date: 11/25/2021 ?CLINICAL DATA:  Fall with right leg pain EXAM: CT

## 2021-12-01 NOTE — Progress Notes (Signed)
Mobility Specialist Progress Note  ? ? 12/01/21 1604  ?Mobility  ?Activity Ambulated with assistance to bathroom  ?Level of Assistance Standby assist, set-up cues, supervision of patient - no hands on  ?Assistive Device Front wheel walker  ?RLE Weight Bearing WBAT  ?Distance Ambulated (ft) 12 ft  ?Activity Response Tolerated well  ?$Mobility charge 1 Mobility  ? ?Pt received sitting up EOB attempting to transfer to [facility] alone and agreeable to help. Had void on BSC. Ambulated with no complaints to BR sink for bath with NT. Left pt seated at sink and NT present.  ? ?Hildred Alamin ?Mobility Specialist  ?Primary: 5[address] M.S. Phone: [phone] ?Secondary: [address] Phone: [phone] ?  ?

## 2021-12-01 NOTE — Progress Notes (Signed)
? ? ? ?  Subjective: ?Doing well this morning.  States she was able to mobilize 6 times to the bathroom yesterday.  Endorses not having any significant pain with mobility.  Pleased with her progress since surgery.  Denies distal numbness and tingling. ? ?Objective:  ? ?VITALS:   ?Vitals:  ? 11/30/21 0859 11/30/21 1541 11/30/21 2000 12/01/21 0457  ?BP: 130/72 136/65 (!) 144/58 124/64  ?Pulse: 90 97 (!) 106 89  ?Resp: '19 18 18 17  '$ ?Temp: (!) 97.3 ?F (36.3 ?C) 98 ?F (36.7 ?C) 98.3 ?F (36.8 ?C) 99.1 ?F (37.3 ?C)  ?TempSrc: Oral Oral Oral Oral  ?SpO2: 100% 99% 100% 99%  ?Weight:      ?Height:      ? ? ?Sensation intact distally ?Intact pulses distally ?Dorsiflexion/Plantar flexion intact ?Incision: dressing C/D/I ?No cellulitis present ?Compartment soft ? ? ?Lab Results  ?Component Value Date  ? WBC 16.9 (H) 11/30/2021  ? HGB 10.0 (L) 11/30/2021  ? HCT 29.8 (L) 11/30/2021  ? MCV 86.1 11/30/2021  ? PLT 453 (H) 11/30/2021  ? ?BMET ?   ?Component Value Date/Time  ? NA 136 11/28/2021 0105  ? K 3.9 11/28/2021 0105  ? CL 107 11/28/2021 0105  ? CO2 23 11/28/2021 0105  ? GLUCOSE 122 (H) 11/28/2021 0105  ? BUN 24 (H) 11/28/2021 0105  ? CREATININE 0.91 11/28/2021 0105  ? CREATININE 0.96 02/21/2018 1259  ? CALCIUM 8.0 (L) 11/28/2021 0105  ? GFRNONAA >60 11/28/2021 0105  ? GFRNONAA >60 02/21/2018 1259  ? ? ? ? ?Xray: Postop x-rays demonstrate total hip arthroplasty in excellent position, no fracture ? ?Assessment/Plan: ?4 Days Post-Op  ? ?Principal Problem: ?  Closed right hip fracture ([place]) ?Active Problems: ?  Asthma, chronic ?  ABLA (acute blood loss anemia) ?  Leucocytosis ? ?Right total hip arthroplasty for femoral neck fracture 11/28/2021 ? ?Post op recs: ?WB: WBAT RLE, posterior hip precautions x6 weeks ?Abx: ancef x23 hours post op ?Imaging: PACU pelvis Xray ?Dressing: Aquacell, keep intact until follow up ?DVT prophylaxis: Aspirin 81BID starting POD1 ?Follow up: 2 weeks after surgery for a wound check with Dr. Zachery Dakins at  [facility].  ?Address: [address] [place], [place], [place] [address]  ?Office Phone: [phone] ? ? ? ?Mianna Iezzi A Naz Denunzio ?12/01/2021, 7:13 AM ? ? ?Charlies Constable, MD ? ?Contact information:   ?Weekdays 7am-5pm epic message Dr. Zachery Dakins, or call office for patient follow up: [phone] ?After hours and holidays please check [email or web address] for group call information for [facility] ? ?  ?

## 2021-12-01 NOTE — TOC Progression Note (Addendum)
Transition of Care (TOC) - Progression Note  ? ? ?Patient Details  ?Name: Sheila Goodwin ?MRN: [id number] ?Date of Birth: February 24, 1956 ? ?Transition of Care (TOC) CM/SW Contact  ?Emeterio Reeve, LCSW ?Phone Number: ?12/01/2021, 3:31 PM ? ?Clinical Narrative:    ? ?CSW gave pt bed offers. Pt stated she is not ready to DC and she wants her husband to visit these facilities before giving an answer. CSW explained that DC order is in and a decision soon to start insurance auth. Facility will need to start auth.  ? ?Expected Discharge Plan: [place] ?Barriers to Discharge: Continued Medical Work up, [employer] ? ?Expected Discharge Plan and Services ?Expected Discharge Plan: [place] ?In-house Referral: Clinical Social Work ?  ?  ?Living arrangements for the past 2 months: [place] ?                ?  ?  ?  ?  ?  ?  ?  ?  ?  ?  ? ? ?Social Determinants of Health (SDOH) Interventions ?  ? ?Readmission Risk Interventions ?   ? View : No data to display.  ?  ?  ?  ? ?Emeterio Reeve, LCSW ?Clinical Social Worker ? ?

## 2021-12-02 NOTE — Progress Notes (Signed)
Patient was seen at the bedside this morning.  She was sleeping, when I woke her up, she was angry and  declined physical examination . ?No change in the medical management.  She is medically stable for discharge to a skilled nursing facility as soon as possible.  Discharge orders and summary are in place ?

## 2021-12-02 NOTE — TOC Transition Note (Signed)
Transition of Care (TOC) - CM/SW Discharge Note ? ? ?Patient Details  ?Name: Sheila Goodwin ?MRN: [id number] ?Date of Birth: 01/05/1956 ? ?Transition of Care (TOC) CM/SW Contact:  ?Emeterio Reeve, LCSW ?Phone Number: ?12/02/2021, 3:49 PM ? ? ?Clinical Narrative:    ? ?Per MD patient ready for DC to [facility]. RN, patient, patient's family, and facility notified of DC. Discharge Summary and FL2 sent to facility. DC packet on chart. Insurance Josem Kaufmann has been received.  Ambulance transport requested for patient.  ?  ?RN to call report to [phone].  ? ?CSW will sign off for now as social work intervention is no longer needed. Please consult [place] again if new needs arise. ? ? ?Final next level of care: [place] ?Barriers to Discharge: Continued Medical Work up ? ? ?Patient Goals and CMS Choice ?Patient states their goals for this hospitalization and ongoing recovery are:: get stronger ?CMS Medicare.gov Compare Post Acute Care list provided to:: Patient Represenative (must comment) (Spouse) ?Choice offered to / list presented to : Spouse ? ?Discharge Placement ?  ?           ?Patient chooses bed at: [facility] ?Patient to be transferred to facility by: ptar ?Name of family member notified: husband [name], pt called on hone while csw was in the room ?Patient and family notified of of transfer: 12/02/21 ? ?Discharge Plan and Services ?In-house Referral: Clinical Social Work ?  ?           ?  ?  ?  ?  ?  ?  ?  ?  ?  ?  ? ?Social Determinants of Health (SDOH) Interventions ?  ? ? ?Readmission Risk Interventions ?   ? View : No data to display.  ?  ?  ?  ? ?Emeterio Reeve, LCSW ?Clinical Social Worker ? ? ? ? ?

## 2021-12-02 NOTE — Care Management Important Message (Signed)
Important Message ? ?Patient Details  ?Name: Sheila Goodwin ?MRN: [id number] ?Date of Birth: 10-28-1955 ? ? ?Medicare Important Message Given:  Yes ? ? ? ? ?[name] ?12/02/2021, 4:08 PM ?

## 2021-12-02 NOTE — Progress Notes (Signed)
Call to give report to [facility] and they stated they have no record of the patient coming. Sheila Goodwin the Admission director call back and they are not prepared to accept the patient tonight due to missed communication. Pt IV has been discontinue is preparation of been discharge tonight via PTAR. ? ?Notify MD on call. ?

## 2021-12-03 ENCOUNTER — Inpatient Hospital Stay (HOSPITAL_COMMUNITY): Payer: Medicare Other

## 2021-12-03 DIAGNOSIS — D72829 Elevated white blood cell count, unspecified: Secondary | ICD-10-CM | POA: Diagnosis not present

## 2021-12-03 DIAGNOSIS — R2689 Other abnormalities of gait and mobility: Secondary | ICD-10-CM | POA: Diagnosis not present

## 2021-12-03 DIAGNOSIS — S72001D Fracture of unspecified part of neck of right femur, subsequent encounter for closed fracture with routine healing: Secondary | ICD-10-CM | POA: Diagnosis not present

## 2021-12-03 DIAGNOSIS — Z4889 Encounter for other specified surgical aftercare: Secondary | ICD-10-CM | POA: Diagnosis not present

## 2021-12-03 DIAGNOSIS — Z1159 Encounter for screening for other viral diseases: Secondary | ICD-10-CM | POA: Diagnosis not present

## 2021-12-03 DIAGNOSIS — M160 Bilateral primary osteoarthritis of hip: Secondary | ICD-10-CM | POA: Diagnosis not present

## 2021-12-03 DIAGNOSIS — J45909 Unspecified asthma, uncomplicated: Secondary | ICD-10-CM | POA: Diagnosis not present

## 2021-12-03 DIAGNOSIS — M48061 Spinal stenosis, lumbar region without neurogenic claudication: Secondary | ICD-10-CM | POA: Diagnosis not present

## 2021-12-03 DIAGNOSIS — E46 Unspecified protein-calorie malnutrition: Secondary | ICD-10-CM | POA: Diagnosis not present

## 2021-12-03 DIAGNOSIS — M5136 Other intervertebral disc degeneration, lumbar region: Secondary | ICD-10-CM | POA: Diagnosis not present

## 2021-12-03 DIAGNOSIS — Z96641 Presence of right artificial hip joint: Secondary | ICD-10-CM | POA: Diagnosis not present

## 2021-12-03 DIAGNOSIS — M6281 Muscle weakness (generalized): Secondary | ICD-10-CM | POA: Diagnosis not present

## 2021-12-03 DIAGNOSIS — E669 Obesity, unspecified: Secondary | ICD-10-CM | POA: Diagnosis not present

## 2021-12-03 DIAGNOSIS — D62 Acute posthemorrhagic anemia: Secondary | ICD-10-CM | POA: Diagnosis not present

## 2021-12-03 NOTE — Progress Notes (Signed)
? ? ? ?  Subjective: ?Patient doing very well in regards to the hip replacement.  She is mobilizing well out of bed with minimal assistance.  She denies distal numbness and tingling in the right lower extremity.  She does endorse frustration that she was not communicated with until late in the evening that she would not be going to rehab yesterday.  Hopeful for discharge to rehab today. ? ?Objective:  ? ?VITALS:   ?Vitals:  ? 12/02/21 0817 12/02/21 1900 12/03/21 0514 12/03/21 0724  ?BP: (!) 122/51 (!) 155/71 122/63 (!) 124/52  ?Pulse: 86 93 85 83  ?Resp: '18 18 18 16  '$ ?Temp: 98.2 ?F (36.8 ?C) 97.8 ?F (36.6 ?C) 98.7 ?F (37.1 ?C) 98.2 ?F (36.8 ?C)  ?TempSrc: Oral Oral Oral Oral  ?SpO2: 97% 94% 95% 100%  ?Weight:      ?Height:      ? ? ?Sensation intact distally ?Intact pulses distally ?Dorsiflexion/Plantar flexion intact ?Incision: dressing C/D/I ?No cellulitis present ?Compartment soft ? ? ?Lab Results  ?Component Value Date  ? WBC 16.9 (H) 11/30/2021  ? HGB 10.0 (L) 11/30/2021  ? HCT 29.8 (L) 11/30/2021  ? MCV 86.1 11/30/2021  ? PLT 453 (H) 11/30/2021  ? ?BMET ?   ?Component Value Date/Time  ? NA 136 11/28/2021 0105  ? K 3.9 11/28/2021 0105  ? CL 107 11/28/2021 0105  ? CO2 23 11/28/2021 0105  ? GLUCOSE 122 (H) 11/28/2021 0105  ? BUN 24 (H) 11/28/2021 0105  ? CREATININE 0.91 11/28/2021 0105  ? CREATININE 0.96 02/21/2018 1259  ? CALCIUM 8.0 (L) 11/28/2021 0105  ? GFRNONAA >60 11/28/2021 0105  ? GFRNONAA >60 02/21/2018 1259  ? ? ? ? ?Xray: Postop x-rays demonstrate total hip arthroplasty in excellent position, no fracture ? ?Assessment/Plan: ?6 Days Post-Op  ? ?Principal Problem: ?  Closed right hip fracture ([place]) ?Active Problems: ?  Asthma, chronic ?  ABLA (acute blood loss anemia) ?  Leucocytosis ? ?Right total hip arthroplasty for femoral neck fracture 11/28/2021 ? ?Post op recs: ?WB: WBAT RLE, posterior hip precautions x6 weeks ?Abx: ancef x23 hours post op ?Imaging: PACU pelvis Xray ?Dressing: Aquacell changed by MD  4/19, incision is healing well, keep intact until follow up ?DVT prophylaxis: Aspirin 81BID starting POD1 ?Follow up: 2 weeks after surgery for a wound check with Dr. Zachery Dakins at [facility].  ?Address: [address] [place], [place], [place] [address]  ?Office Phone: [phone] ? ? ? ?Jari Dipasquale A Abelina Ketron ?12/03/2021, 7:39 AM ? ? ?Charlies Constable, MD ? ?Contact information:   ?Weekdays 7am-5pm epic message Dr. Zachery Dakins, or call office for patient follow up: [phone] ?After hours and holidays please check [email or web address] for group call information for [facility] ? ?  ?

## 2021-12-03 NOTE — Progress Notes (Signed)
Per MD via secure chat, pt cleared for DC. ?

## 2021-12-03 NOTE — Progress Notes (Signed)
Pt transported off unit via PTAR gurney. Husband and all belongings at pt side.Pt remains A&Ox4 and stable at baseline. ?

## 2021-12-03 NOTE — TOC Transition Note (Signed)
Transition of Care (TOC) - CM/SW Discharge Note ? ? ?Patient Details  ?Name: Sheila Goodwin ?MRN: [id number] ?Date of Birth: 08/23/1955 ? ?Transition of Care (TOC) CM/SW Contact:  ?Emeterio Reeve, LCSW ?Phone Number: ?12/03/2021, 11:29 AM ? ? ?Clinical Narrative:    ? ?Per MD patient ready for DC to [facility]. RN, patient, patient's family, and facility notified of DC. Discharge Summary and FL2 sent to facility. DC packet on chart. Insurance Josem Kaufmann has been received.  Ambulance transport requested for patient.  ?  ?RN to call report to [phone].  ? ?CSW will sign off for now as social work intervention is no longer needed. Please consult [place] again if new needs arise. ? ? ?Final next level of care: [place] ?Barriers to Discharge: Continued Medical Work up ? ? ?Patient Goals and CMS Choice ?Patient states their goals for this hospitalization and ongoing recovery are:: get stronger ?CMS Medicare.gov Compare Post Acute Care list provided to:: Patient Represenative (must comment) (Spouse) ?Choice offered to / list presented to : Spouse ? ?Discharge Placement ?  ?           ?Patient chooses bed at: [facility] ?Patient to be transferred to facility by: ptar ?Name of family member notified: husband [name], pt called on hone while csw was in the room ?Patient and family notified of of transfer: 12/02/21 ? ?Discharge Plan and Services ?In-house Referral: Clinical Social Work ?  ?           ?  ?  ?  ?  ?  ?  ?  ?  ?  ?  ? ?Social Determinants of Health (SDOH) Interventions ?  ? ? ?Readmission Risk Interventions ?   ? View : No data to display.  ?  ?  ?  ? ?Emeterio Reeve, LCSW ?Clinical Social Worker ? ? ? ? ?

## 2021-12-03 NOTE — Progress Notes (Addendum)
PTAR at pt bedside for transport yo [facility]. Pt very anxious and requesting to eat her lunch before transport. PTAR staff agreeable to wait for pt to eat before transporting.  ? ?No IVs present; pt states they were removed on previous shift.  ? ?All pt belongings gathered and at bedside. ? ?Report called to [facility] at this time. ?

## 2021-12-03 NOTE — Progress Notes (Signed)
Occupational Therapy Treatment ?Patient Details ?Name: Sheila Goodwin ?MRN: [id number] ?DOB: Nov 21, 1955 ?Today's Date: 12/03/2021 ? ? ?History of present illness Sheila Goodwin is a 66 y.o. female presenting to [facility] after a fall resulting in a proximal femur fx. Pt is s/p R THA posterior approach. During admission, pt experienced unwitnessed fall while in bathroom on 12/03/21. Imaging negative. PMH includes asthma. ?  ?OT comments ? Focus of session on educating pt in posterior hip precautions. Pt able to perform bed mobility with supervision, pillow between knees. She ambulated with RW and min guard assist to bathroom for toileting and sink for standing grooming. Pt able to release walker in static standing. Total assist to don shoes with laces. PT with pt at end of session.   ? ?Recommendations for follow up therapy are one component of a multi-disciplinary discharge planning process, led by the attending physician.  Recommendations may be updated based on patient status, additional functional criteria and insurance authorization. ?   ?Follow Up Recommendations ? Skilled nursing-short term rehab (<3 hours/day)  ?  ?Assistance Recommended at Discharge Frequent or constant Supervision/Assistance  ?Patient can return home with the following ? A lot of help with bathing/dressing/bathroom;A little help with walking and/or transfers ?  ?Equipment Recommendations ? BSC/3in1  ?  ?Recommendations for Other Services   ? ?  ?Precautions / Restrictions Precautions ?Precautions: Posterior Hip;Fall ?Precaution Comments: pt able to recall 2/3, educated in pillow between knees when rolling to L side ?Restrictions ?Weight Bearing Restrictions: Yes ?RLE Weight Bearing: Weight bearing as tolerated  ? ? ?  ? ?Mobility Bed Mobility ?Overal bed mobility: Needs Assistance ?Bed Mobility: Rolling, Sidelying to Sit ?Rolling: Supervision ?Sidelying to sit: Supervision ?  ?  ?  ?General bed mobility comments: use of rail, cues for pillow between  knees ?  ? ?Transfers ?Overall transfer level: Needs assistance ?Equipment used: Rolling walker (2 wheels) ?Transfers: Sit to/from Stand ?Sit to Stand: Min guard, From elevated surface ?  ?  ?  ?  ?  ?  ?  ?  ?Balance Overall balance assessment: Needs assistance ?  ?Sitting balance-Leahy Scale: Good ?  ?  ?Standing balance support: No upper extremity supported, During functional activity ?  ?Standing balance comment: fair at sink, poor dynamic balance requring walker ?  ?  ?  ?  ?  ?  ?  ?  ?  ?  ?  ?   ? ?ADL either performed or assessed with clinical judgement  ? ?ADL Overall ADL's : Needs assistance/impaired ?  ?  ?Grooming: Wash/dry hands;Standing;Min guard ?  ?  ?  ?  ?  ?  ?  ?Lower Body Dressing: Total assistance;Sitting/lateral leans ?Lower Body Dressing Details (indicate cue type and reason): for shoes, recommended slip ons ?Toilet Transfer: Min guard;Ambulation;BSC/3in1;Rolling walker (2 wheels) ?  ?Toileting- Water quality scientist and Hygiene: Min guard;Sit to/from stand ?  ?  ?  ?Functional mobility during ADLs: Rolling walker (2 wheels);Minimal assistance ?General ADL Comments: Began educating pt in use of AE for LB bathing and dressing. ?  ? ?Extremity/Trunk Assessment   ?  ?  ?  ?  ?  ? ?Vision   ?  ?  ?Perception   ?  ?Praxis   ?  ? ?Cognition Arousal/Alertness: Awake/alert ?Behavior During Therapy: Anxious ?Overall Cognitive Status: Within Functional Limits for tasks assessed ?  ?  ?  ?  ?  ?  ?  ?  ?  ?  ?  ?  ?  ?  ?  ?  ?  General Comments: Pt with difficulty generalizing hip precautions. Upset with herself for not calling for help earlier today. ?  ?  ?   ?Exercises   ? ?  ?Shoulder Instructions   ? ? ?  ?General Comments    ? ? ?Pertinent Vitals/ Pain       Pain Assessment ?Pain Assessment: Faces ?Faces Pain Scale: Hurts little more ?Pain Location: R hip ?Pain Descriptors / Indicators: Sore ?Pain Intervention(s): Monitored during session ? ?Home Living   ?  ?  ?  ?  ?  ?  ?  ?  ?  ?  ?  ?  ?  ?   ?  ?  ?  ?  ? ?  ?Prior Functioning/Environment    ?  ?  ?  ?   ? ?Frequency ? Min 2X/week  ? ? ? ? ?  ?Progress Toward Goals ? ?OT Goals(current goals can now be found in the care plan section) ? Progress towards OT goals: Progressing toward goals ? ?Acute Rehab OT Goals ?OT Goal Formulation: With patient ?Time For Goal Achievement: 12/12/21 ?Potential to Achieve Goals: Good  ?Plan Discharge plan remains appropriate   ? ?Co-evaluation ? ? ?   ?  ?  ?  ?  ? ?  ?AM-PAC OT "6 Clicks" Daily Activity     ?Outcome Measure ? ? Help from another person eating meals?: None ?Help from another person taking care of personal grooming?: A Little ?Help from another person toileting, which includes using toliet, bedpan, or urinal?: A Little ?Help from another person bathing (including washing, rinsing, drying)?: A Lot ?Help from another person to put on and taking off regular upper body clothing?: A Little ?Help from another person to put on and taking off regular lower body clothing?: A Lot ?6 Click Score: 17 ? ?  ?End of Session Equipment Utilized During Treatment: Gait belt;Rolling walker (2 wheels) ? ?OT Visit Diagnosis: Unsteadiness on feet (R26.81);Pain;Muscle weakness (generalized) (M62.81) ?Pain - Right/Left: Right ?Pain - part of body: Hip ?  ?Activity Tolerance Patient tolerated treatment well ?  ?Patient Left in bed;Other (comment) (with PT) ?  ?Nurse Communication   ?  ? ?   ? ?Time: 7096-2836 ?OT Time Calculation (min): 21 min ? ?Charges: OT General Charges ?$OT Visit: 1 Visit ?OT Treatments ?$Self Care/Home Management : 8-22 mins ? ?Nestor Lewandowsky, OTR/L ?Acute Rehabilitation Services ?Pager: [phone] ?Office: [phone]  ?Malka So ?12/03/2021, 12:19 PM ?

## 2021-12-03 NOTE — Progress Notes (Signed)
MD at pt bedside for post fall assessment. Per MD, no CT head needed since no visible injury is noted. R hip XR ordered at this time. ?

## 2021-12-03 NOTE — Progress Notes (Signed)
Notified by NT that pt was found on bathroom floor after an unwitnessed mechanical fall.  ?Upon arrival to pt room, pt was found on bathroom floor A&Ox4 at baseline. No visible injuries but pt did state that she "slipped" while getting up from the toilet with walker and she hit her posterior head on the wall.  ? ?Assisted pt back to bed for full assessment; no changes from baseline. Bed alarm on and call light within reach. MD notified. ?

## 2021-12-03 NOTE — Progress Notes (Signed)
Patient seen and examined at the bedside this morning.  She could not be discharged yesterday due to lack of communication (as per the report in the chart). ?She  fell in her room this morning.  She he bumped her head.  I thoroughly examined her this morning.  She remains hemodynamically stable.  There is no concern for head injury.  No bruises or lacerations seen.  She complained of right hip pain after the fall.  We did a right hip x-ray does not show any new findings, well-seated prosthesis. ?She is medically stable for discharge to skilled nursing facility as soon as possible.  There is no change in the medical management.  Discharge orders and summary are in. ?

## 2021-12-03 NOTE — Progress Notes (Signed)
Physical Therapy Treatment ?Patient Details ?Name: Sheila Goodwin ?MRN: [id number] ?DOB: [birth year] ?Today's Date: 12/03/2021 ? ? ?History of Present Illness Blanche is a 66 y.o. female presenting to [facility] after a fall resulting in a proximal femur fx. Pt is s/p R THA posterior approach. During admission, pt experienced unwitnessed fall while in bathroom on 12/03/21. Imaging negative. PMH includes asthma. ? ?  ?PT Comments  ? ? Patient is progressing towards goals. Pt required min guard A using RW for transfers and ambulation. Pt still requiring cues to maintain precautions during mobility. Pt is anxious and still remains somewhat self-limiting at times, especially after her unwitnessed fall this morning. She required encouragement to increase ambulation distance this session. Pt given cues for improved gait pattern with walking with excellent carryover. PT still recommending SNF upon D/C. Pt will benefit from continued skilled PT to maximize pt's safety and independence with functional mobility. ?   ?Recommendations for follow up therapy are one component of a multi-disciplinary discharge planning process, led by the attending physician.  Recommendations may be updated based on patient status, additional functional criteria and insurance authorization. ? ?Follow Up Recommendations ? Skilled nursing-short term rehab (<3 hours/day) ?  ?  ?Assistance Recommended at Discharge Frequent or constant Supervision/Assistance  ?Patient can return home with the following Assistance with cooking/housework;Assist for transportation;Help with stairs or ramp for entrance;A little help with walking and/or transfers;A little help with bathing/dressing/bathroom ?  ?Equipment Recommendations ? Rolling walker (2 wheels)  ?  ?Recommendations for Other Services   ? ? ?  ?Precautions / Restrictions Precautions ?Precautions: Posterior Hip;Fall ?Precaution Comments: verbally reviewed and demonstrated, pt recalls 3/3 ?Restrictions ?Weight  Bearing Restrictions: Yes ?RLE Weight Bearing: Weight bearing as tolerated  ?  ? ?Mobility ? Bed Mobility ?Overal bed mobility: Needs Assistance ?  ?  ?  ?  ?Sit to supine: Min guard ?  ?General bed mobility comments: Pt seated at EOB with OT upon arrival. Pt declined wanting to sit up in the chair and be left alone. Pt returned to bed after ambulation. Pt required min guard A for LE management and reminder to use pillow between knees to avoid IR for return to supine. ?  ? ?Transfers ?Overall transfer level: Needs assistance ?Equipment used: Rolling walker (2 wheels) ?Transfers: Sit to/from Stand ?Sit to Stand: Min guard, From elevated surface ?  ?Step pivot transfers: Min guard ?  ?  ?  ?General transfer comment: required min gaurd A for safety and steadying with transfers while given cues for hand placement and LE positioning to maintain posterior hip precautions. ?  ? ?Ambulation/Gait ?Ambulation/Gait assistance: Min guard, +2 safety/equipment ?Gait Distance (Feet): 100 Feet ?Assistive device: Rolling walker (2 wheels) ?Gait Pattern/deviations: Step-through pattern, Decreased stance time - right, Decreased step length - left ?Gait velocity: decreased ?Gait velocity interpretation: <1.8 ft/sec, indicate of risk for recurrent falls ?  ?General Gait Details: VC's for improved posture, heel strike on RLE and longer step length on LLE for improved step-through gait pattern. Pt saying sequencing out loud to help herself remember. Pt needed encouragement for longer gait distance ambulated today. ? ? ?Stairs ?  ?  ?  ?  ?  ? ? ?Wheelchair Mobility ?  ? ?Modified Rankin (Stroke Patients Only) ?  ? ? ?  ?Balance Overall balance assessment: Needs assistance ?Sitting-balance support: Feet supported, Single extremity supported ?Sitting balance-Leahy Scale: Good ?Sitting balance - Comments: was able to donn/doff gown with single UE support in sitting requiring supervision  for safety ?  ?Standing balance support: Bilateral  upper extremity supported, During functional activity, Reliant on assistive device for balance ?Standing balance-Leahy Scale: Poor ?Standing balance comment: requiring min guard A in standing for safety and steadying using RW for BUE support ?  ?  ?  ?  ?  ?  ?  ?  ?  ?  ?  ?  ? ?  ?Cognition Arousal/Alertness: Awake/alert ?Behavior During Therapy: Anxious ?Overall Cognitive Status: Within Functional Limits for tasks assessed ?  ?  ?  ?  ?  ?  ?  ?  ?  ?  ?  ?  ?  ?  ?  ?  ?General Comments: Anxious but participatory. Pt needs reminders for precs especially no hip IR during bed mobility ?  ?  ? ?  ?Exercises General Exercises - Lower Extremity ?Quad Sets: 10 reps, Both, Seated ?Heel Slides: 5 reps, Right, Supine ? ?  ?General Comments   ?  ?  ? ?Pertinent Vitals/Pain Pain Assessment ?Pain Assessment: 0-10 ?Pain Score: 4  ?Pain Location: R hip operative site with WB/gait ?Pain Descriptors / Indicators: Discomfort, Grimacing, Guarding ?Pain Intervention(s): Monitored during session  ? ? ?Home Living   ?  ?  ?  ?  ?  ?  ?  ?  ?  ?   ?  ?Prior Function    ?  ?  ?   ? ?PT Goals (current goals can now be found in the care plan section) Acute Rehab PT Goals ?Patient Stated Goal: to get out of the hospital ?PT Goal Formulation: With patient ?Time For Goal Achievement: 12/12/21 ?Potential to Achieve Goals: Good ?Progress towards PT goals: Progressing toward goals ? ?  ?Frequency ? ? ? Min 3X/week ? ? ? ?  ?PT Plan Current plan remains appropriate  ? ? ?Co-evaluation   ?  ?  ?  ?  ? ?  ?AM-PAC PT "6 Clicks" Mobility   ?Outcome Measure ? Help needed turning from your back to your side while in a flat bed without using bedrails?: None ?Help needed moving from lying on your back to sitting on the side of a flat bed without using bedrails?: A Little ?Help needed moving to and from a bed to a chair (including a wheelchair)?: A Little ?Help needed standing up from a chair using your arms (e.g., wheelchair or bedside chair)?: A  Little ?Help needed to walk in hospital room?: A Little ?Help needed climbing 3-5 steps with a railing? : A Lot ?6 Click Score: 18 ? ?  ?End of Session Equipment Utilized During Treatment: Gait belt ?Activity Tolerance: Patient tolerated treatment well ?Patient left: in bed;with call bell/phone within reach;with bed alarm set ?Nurse Communication: Mobility status ?PT Visit Diagnosis: Other abnormalities of gait and mobility (R26.89);Muscle weakness (generalized) (M62.81);Unsteadiness on feet (R26.81);Pain ?Pain - Right/Left: Right ?Pain - part of body: Hip ?  ? ? ?Time: 3094-0768 ?PT Time Calculation (min) (ACUTE ONLY): 36 min ? ?Charges:  $Gait Training: 8-22 mins ?$Therapeutic Exercise: 8-22 mins          ?          ? ?Jonne Ply, SPT ? ? ?Jonne Ply ?12/03/2021, 12:16 PM ? ?

## 2021-12-11 DIAGNOSIS — S72001D Fracture of unspecified part of neck of right femur, subsequent encounter for closed fracture with routine healing: Secondary | ICD-10-CM | POA: Diagnosis not present

## 2021-12-13 DIAGNOSIS — Z79891 Long term (current) use of opiate analgesic: Secondary | ICD-10-CM | POA: Diagnosis not present

## 2021-12-13 DIAGNOSIS — M5135 Other intervertebral disc degeneration, thoracolumbar region: Secondary | ICD-10-CM | POA: Diagnosis not present

## 2021-12-13 DIAGNOSIS — J45909 Unspecified asthma, uncomplicated: Secondary | ICD-10-CM | POA: Diagnosis not present

## 2021-12-13 DIAGNOSIS — M48061 Spinal stenosis, lumbar region without neurogenic claudication: Secondary | ICD-10-CM | POA: Diagnosis not present

## 2021-12-13 DIAGNOSIS — M1612 Unilateral primary osteoarthritis, left hip: Secondary | ICD-10-CM | POA: Diagnosis not present

## 2021-12-13 DIAGNOSIS — M5137 Other intervertebral disc degeneration, lumbosacral region: Secondary | ICD-10-CM | POA: Diagnosis not present

## 2021-12-13 DIAGNOSIS — K5730 Diverticulosis of large intestine without perforation or abscess without bleeding: Secondary | ICD-10-CM | POA: Diagnosis not present

## 2021-12-13 DIAGNOSIS — M6281 Muscle weakness (generalized): Secondary | ICD-10-CM | POA: Diagnosis not present

## 2021-12-13 DIAGNOSIS — Z6833 Body mass index (BMI) 33.0-33.9, adult: Secondary | ICD-10-CM | POA: Diagnosis not present

## 2021-12-13 DIAGNOSIS — E46 Unspecified protein-calorie malnutrition: Secondary | ICD-10-CM | POA: Diagnosis not present

## 2021-12-13 DIAGNOSIS — Z7951 Long term (current) use of inhaled steroids: Secondary | ICD-10-CM | POA: Diagnosis not present

## 2021-12-13 DIAGNOSIS — Z9181 History of falling: Secondary | ICD-10-CM | POA: Diagnosis not present

## 2021-12-13 DIAGNOSIS — S72001D Fracture of unspecified part of neck of right femur, subsequent encounter for closed fracture with routine healing: Secondary | ICD-10-CM | POA: Diagnosis not present

## 2021-12-13 DIAGNOSIS — W19XXXD Unspecified fall, subsequent encounter: Secondary | ICD-10-CM | POA: Diagnosis not present

## 2021-12-13 DIAGNOSIS — M5126 Other intervertebral disc displacement, lumbar region: Secondary | ICD-10-CM | POA: Diagnosis not present

## 2021-12-13 DIAGNOSIS — M5125 Other intervertebral disc displacement, thoracolumbar region: Secondary | ICD-10-CM | POA: Diagnosis not present

## 2021-12-25 DIAGNOSIS — Z09 Encounter for follow-up examination after completed treatment for conditions other than malignant neoplasm: Secondary | ICD-10-CM | POA: Diagnosis not present

## 2021-12-25 DIAGNOSIS — R7309 Other abnormal glucose: Secondary | ICD-10-CM | POA: Diagnosis not present

## 2021-12-25 DIAGNOSIS — D473 Essential (hemorrhagic) thrombocythemia: Secondary | ICD-10-CM | POA: Diagnosis not present

## 2021-12-25 DIAGNOSIS — S72001A Fracture of unspecified part of neck of right femur, initial encounter for closed fracture: Secondary | ICD-10-CM | POA: Diagnosis not present

## 2022-01-08 DIAGNOSIS — S72001D Fracture of unspecified part of neck of right femur, subsequent encounter for closed fracture with routine healing: Secondary | ICD-10-CM | POA: Diagnosis not present

## 2022-01-12 DIAGNOSIS — K5730 Diverticulosis of large intestine without perforation or abscess without bleeding: Secondary | ICD-10-CM | POA: Diagnosis not present

## 2022-01-12 DIAGNOSIS — M1612 Unilateral primary osteoarthritis, left hip: Secondary | ICD-10-CM | POA: Diagnosis not present

## 2022-01-12 DIAGNOSIS — E46 Unspecified protein-calorie malnutrition: Secondary | ICD-10-CM | POA: Diagnosis not present

## 2022-01-12 DIAGNOSIS — Z79891 Long term (current) use of opiate analgesic: Secondary | ICD-10-CM | POA: Diagnosis not present

## 2022-01-12 DIAGNOSIS — Z9181 History of falling: Secondary | ICD-10-CM | POA: Diagnosis not present

## 2022-01-12 DIAGNOSIS — M5135 Other intervertebral disc degeneration, thoracolumbar region: Secondary | ICD-10-CM | POA: Diagnosis not present

## 2022-01-12 DIAGNOSIS — J45909 Unspecified asthma, uncomplicated: Secondary | ICD-10-CM | POA: Diagnosis not present

## 2022-01-12 DIAGNOSIS — W19XXXD Unspecified fall, subsequent encounter: Secondary | ICD-10-CM | POA: Diagnosis not present

## 2022-01-12 DIAGNOSIS — M5137 Other intervertebral disc degeneration, lumbosacral region: Secondary | ICD-10-CM | POA: Diagnosis not present

## 2022-01-12 DIAGNOSIS — M5126 Other intervertebral disc displacement, lumbar region: Secondary | ICD-10-CM | POA: Diagnosis not present

## 2022-01-12 DIAGNOSIS — M6281 Muscle weakness (generalized): Secondary | ICD-10-CM | POA: Diagnosis not present

## 2022-01-12 DIAGNOSIS — M5125 Other intervertebral disc displacement, thoracolumbar region: Secondary | ICD-10-CM | POA: Diagnosis not present

## 2022-01-12 DIAGNOSIS — Z7951 Long term (current) use of inhaled steroids: Secondary | ICD-10-CM | POA: Diagnosis not present

## 2022-01-12 DIAGNOSIS — S72001D Fracture of unspecified part of neck of right femur, subsequent encounter for closed fracture with routine healing: Secondary | ICD-10-CM | POA: Diagnosis not present

## 2022-01-12 DIAGNOSIS — M48061 Spinal stenosis, lumbar region without neurogenic claudication: Secondary | ICD-10-CM | POA: Diagnosis not present

## 2022-01-12 DIAGNOSIS — Z6833 Body mass index (BMI) 33.0-33.9, adult: Secondary | ICD-10-CM | POA: Diagnosis not present

## 2022-01-22 DIAGNOSIS — D473 Essential (hemorrhagic) thrombocythemia: Secondary | ICD-10-CM | POA: Diagnosis not present

## 2022-01-23 DIAGNOSIS — D473 Essential (hemorrhagic) thrombocythemia: Secondary | ICD-10-CM | POA: Diagnosis not present

## 2022-02-10 DIAGNOSIS — S72001D Fracture of unspecified part of neck of right femur, subsequent encounter for closed fracture with routine healing: Secondary | ICD-10-CM | POA: Diagnosis not present

## 2023-04-17 IMAGING — CR DG HIP (WITH OR WITHOUT PELVIS) 2-3V*R*
3 series · 3 of 3 positions shown · non-contrast
Comparison: None.

CLINICAL DATA: Pain after fall.  Right hip pain.  Fall yesterday.

EXAM:
DG HIP (WITH OR WITHOUT PELVIS) 2-3V RIGHT

[pelvis ap]
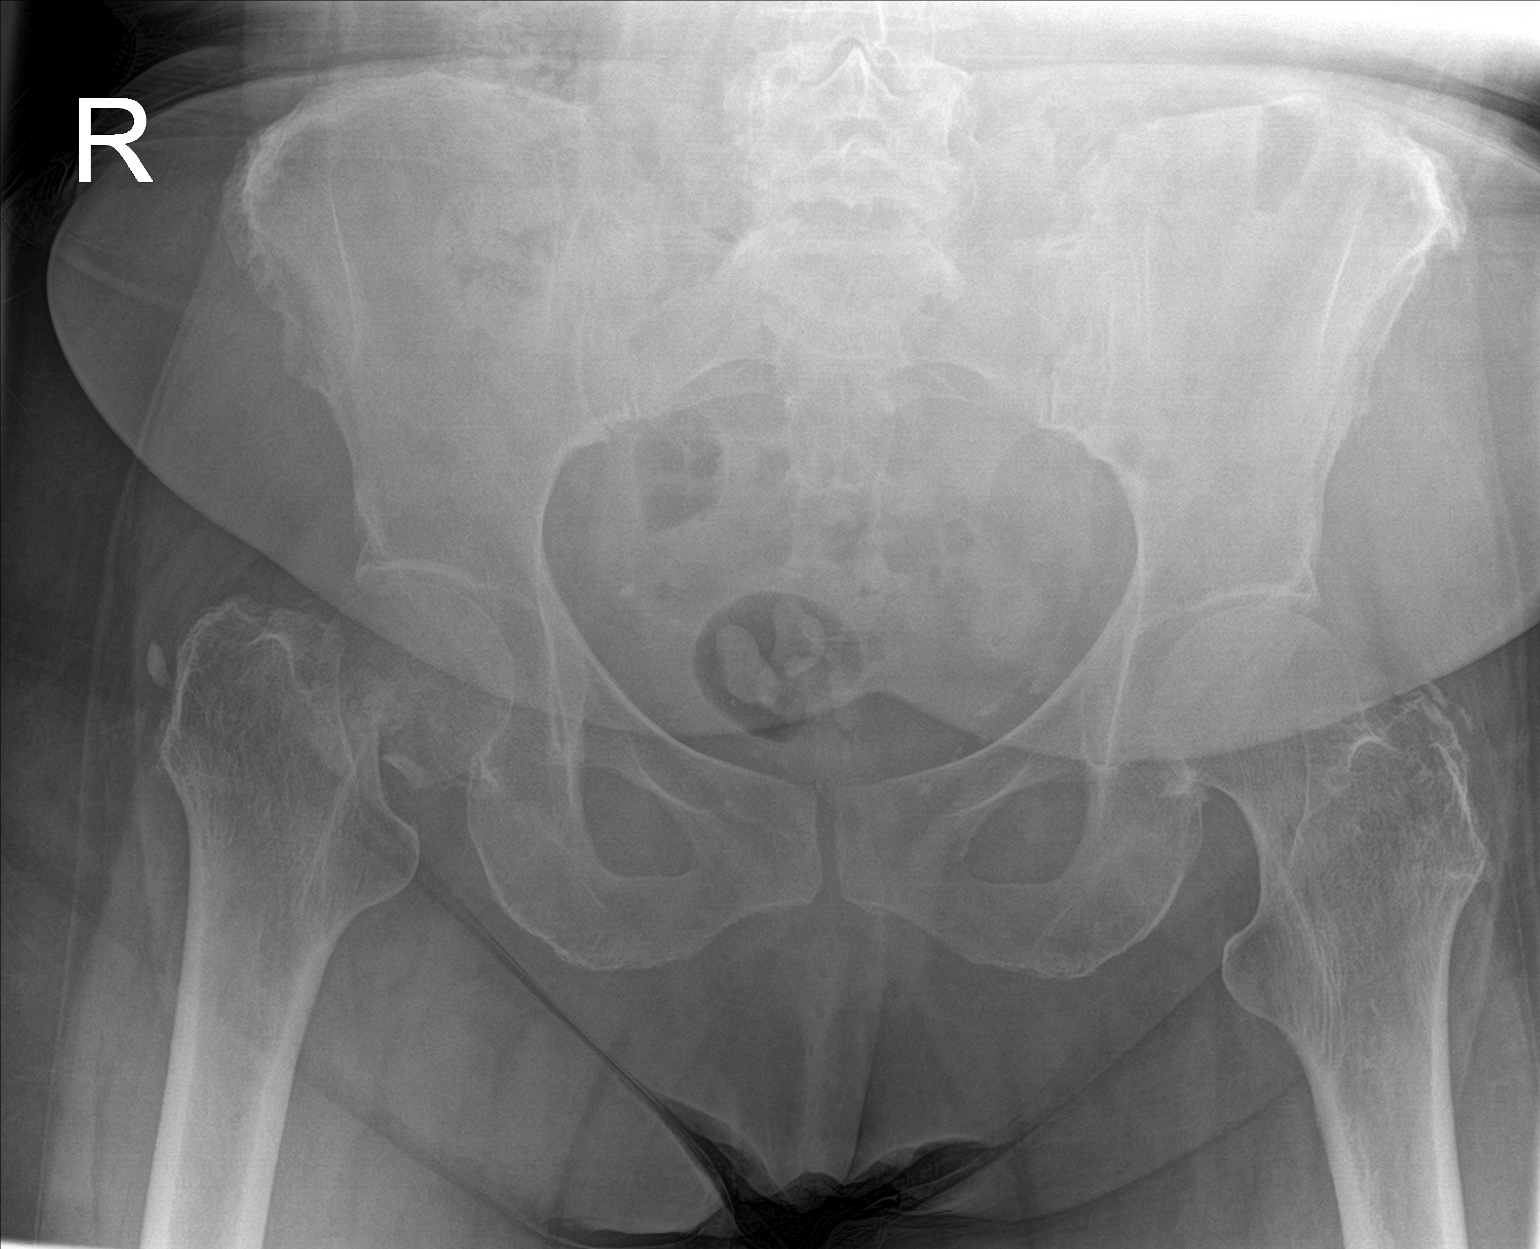

[hip ap]
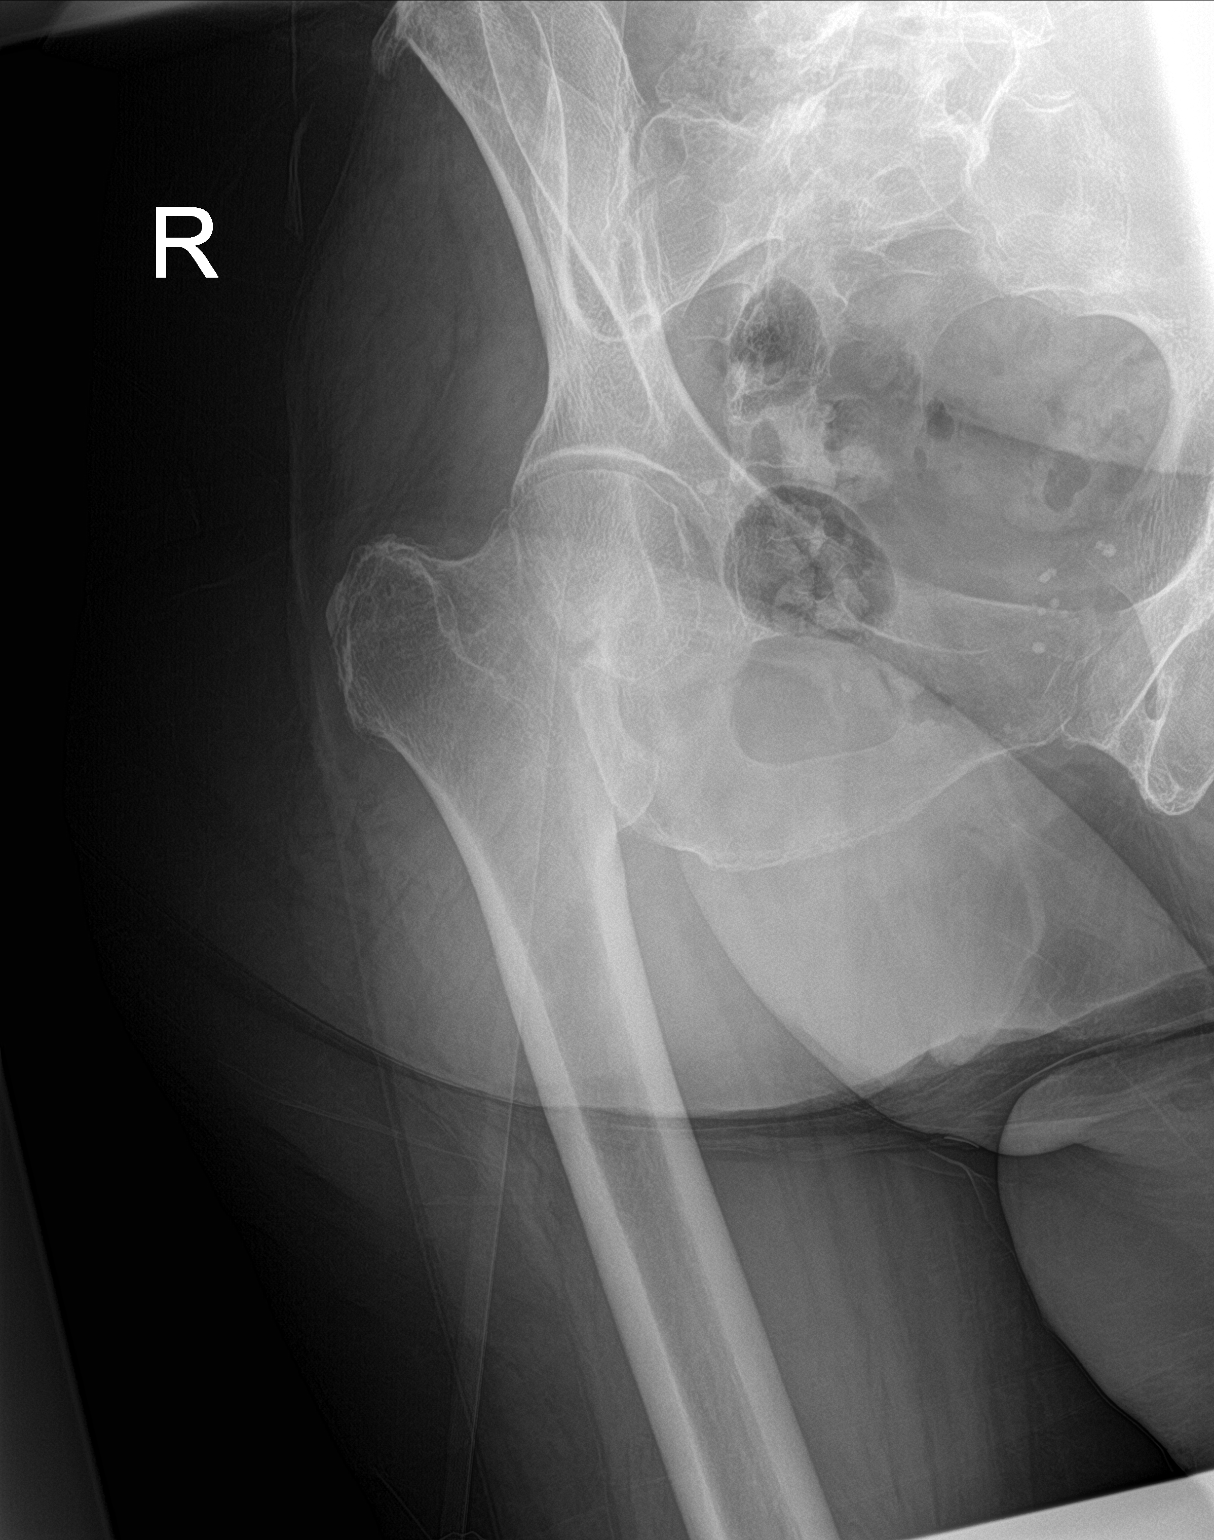

[hip lat]
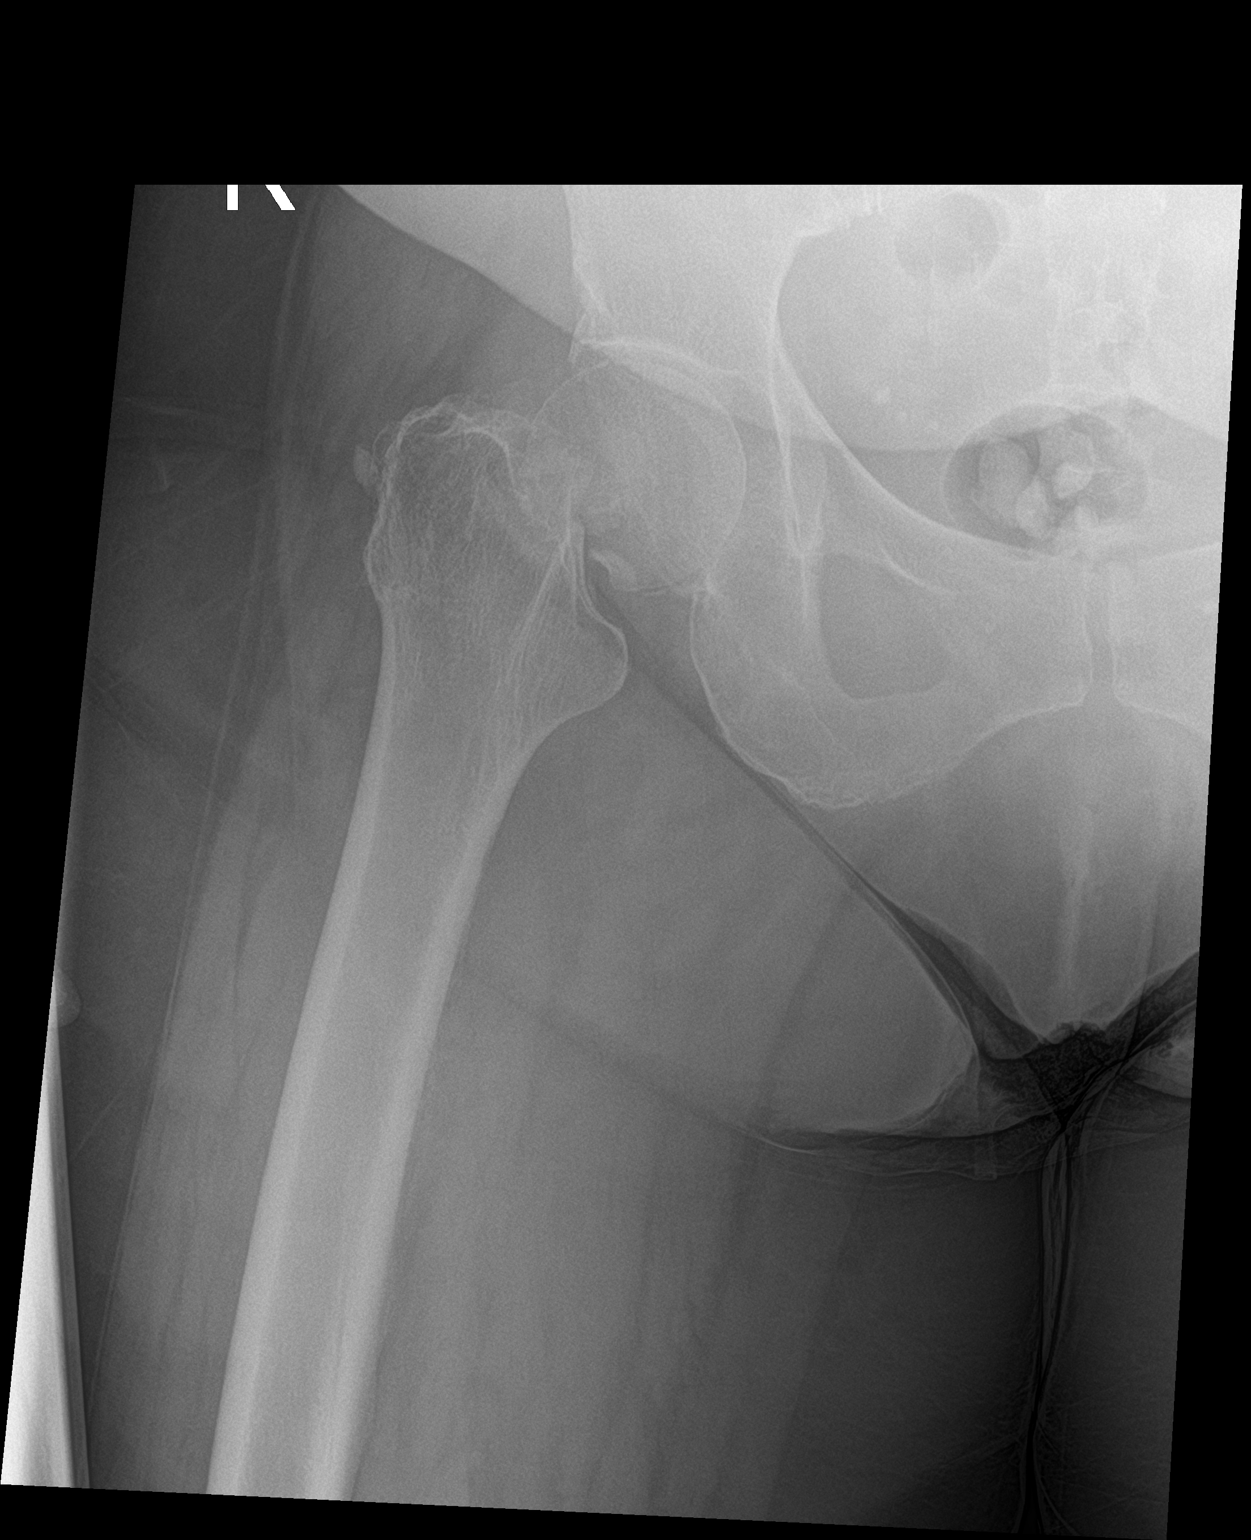

[3 of 3 positions shown; findings below may reference images not displayed]

FINDINGS: There is a displaced right femoral neck fracture with proximal
migration of the femoral shaft. Femoral head remains seated. Intact
pubic rami. Pubic symphysis and sacroiliac joints are congruent.
Mild bilateral hip osteoarthritis.
IMPRESSION: Displaced right femoral neck fracture.

## 2023-04-17 IMAGING — CT CT PELVIS W/O CM
4 of 5 series · 15 of 33 positions shown, 17 images · non-contrast
Comparison: Radiograph 11/25/2021

CLINICAL DATA: Fall with right leg pain

EXAM:
CT PELVIS WITHOUT CONTRAST
TECHNIQUE: Multidetector CT imaging of the pelvis was performed following the
standard protocol without intravenous contrast.
RADIATION DOSE REDUCTION: This exam was performed according to the
departmental dose-optimization program which includes automated
exposure control, adjustment of the mA and/or kV according to
patient size and/or use of iterative reconstruction technique.

[Series 5: l spine 2.0 st · axial · 0.41mm/px · z∈[-152,+208]mm · 3 of 181 slices shown, 4 images]
[im 1/181  soft-tissue]
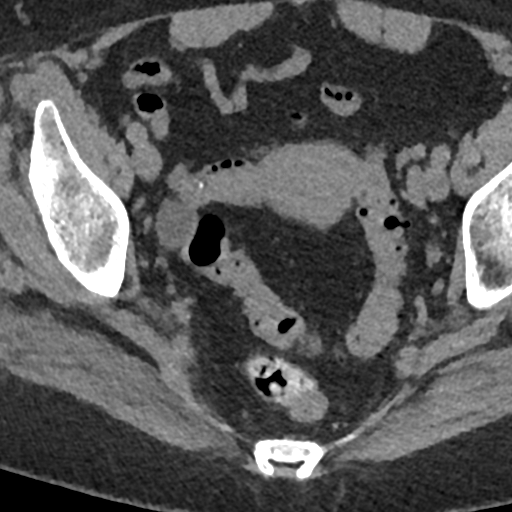
[im 1/181  bone]
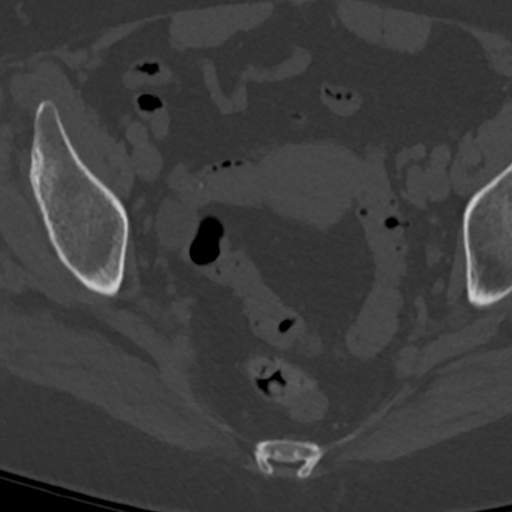
[im 91/181  bone]
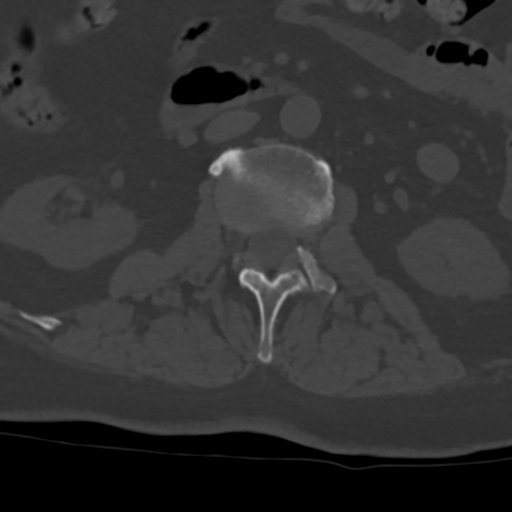
[im 181/181  bone]
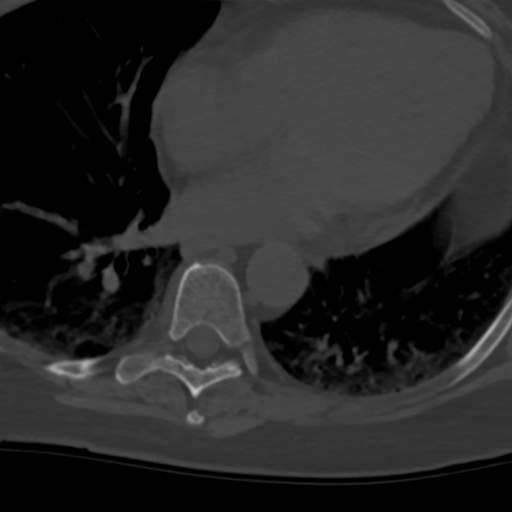

[Series 17: coronal st · coronal · 0.59mm/px · 3 of 188 slices shown]
[im 38/188  bone]
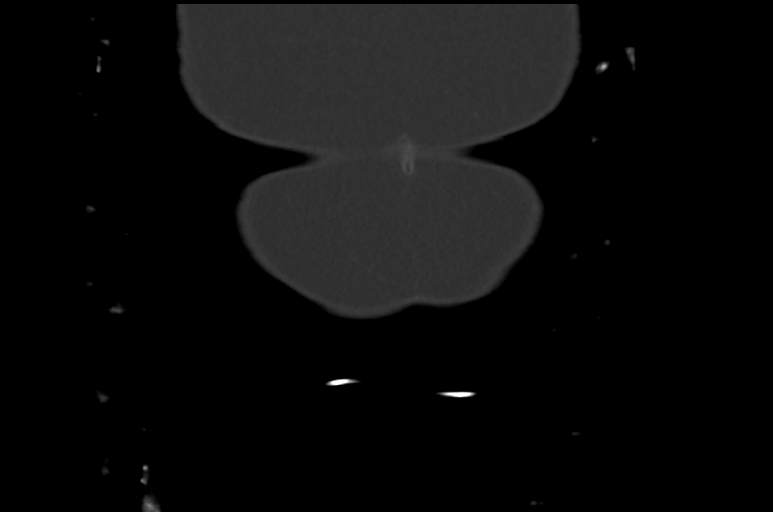
[im 75/188  bone]
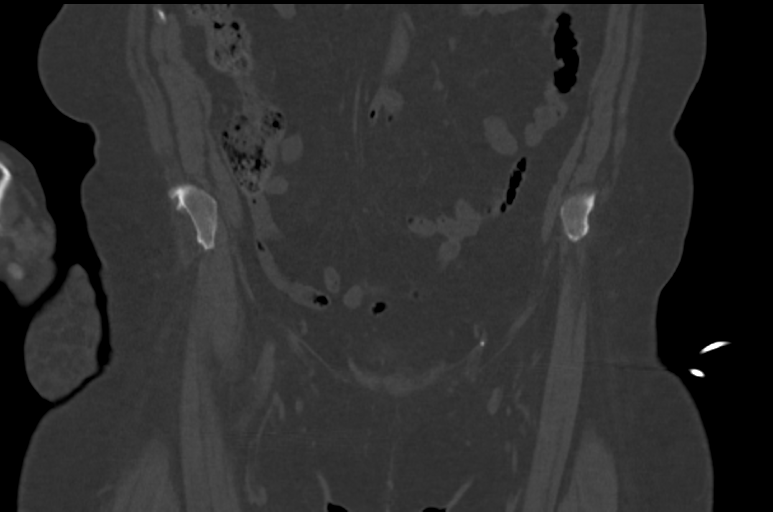
[im 113/188  bone]
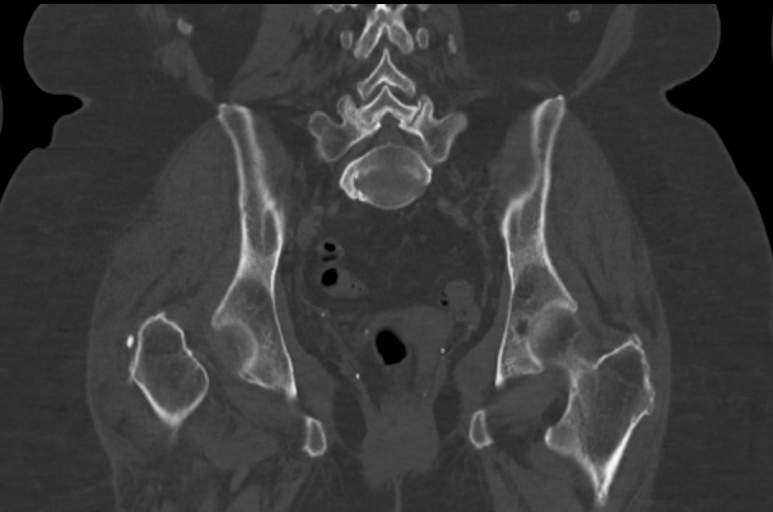

[Series 18: sagittal st · sagittal · 0.62mm/px · 5 of 243 slices shown, 6 images]
[im 81/243  bone]
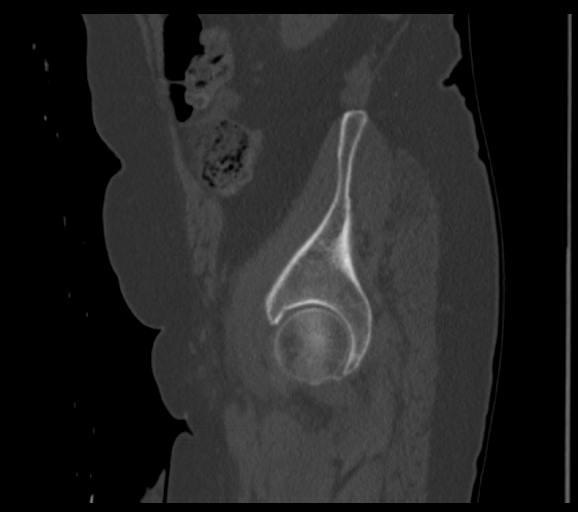
[im 101/243  bone]
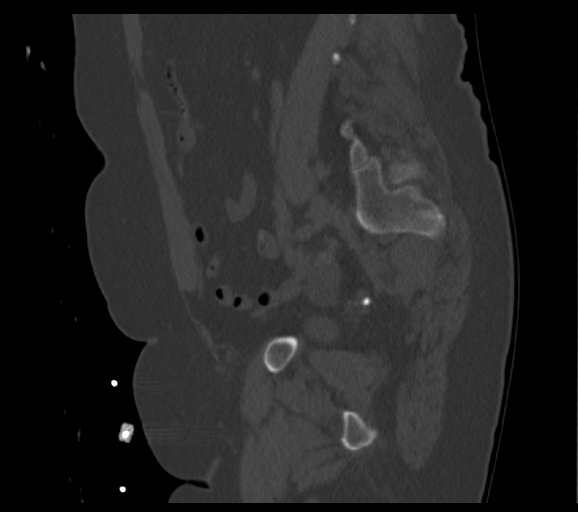
[im 122/243  soft-tissue]
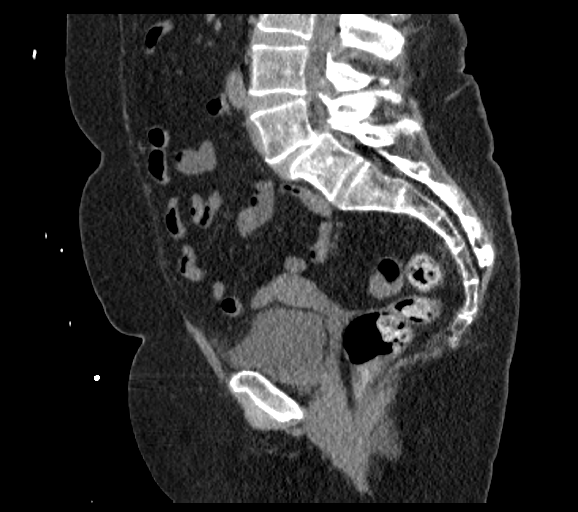
[im 122/243  bone]
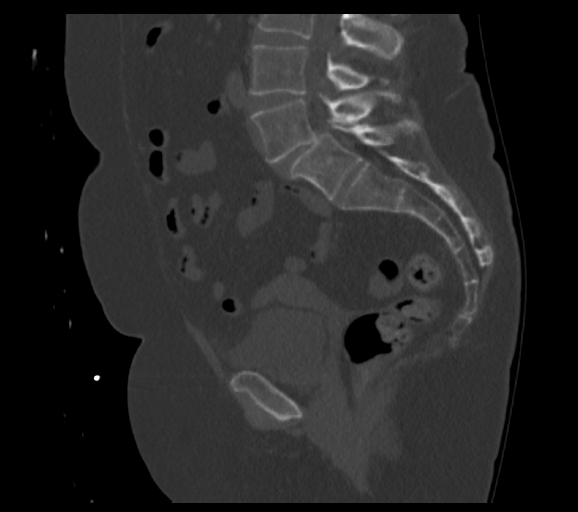
[im 142/243  bone]
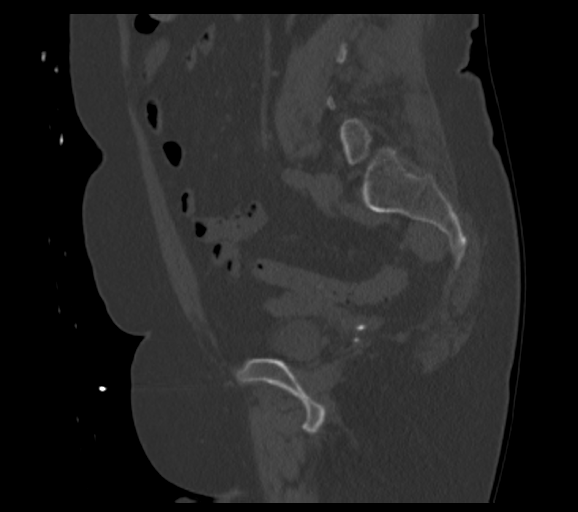
[im 162/243  bone]
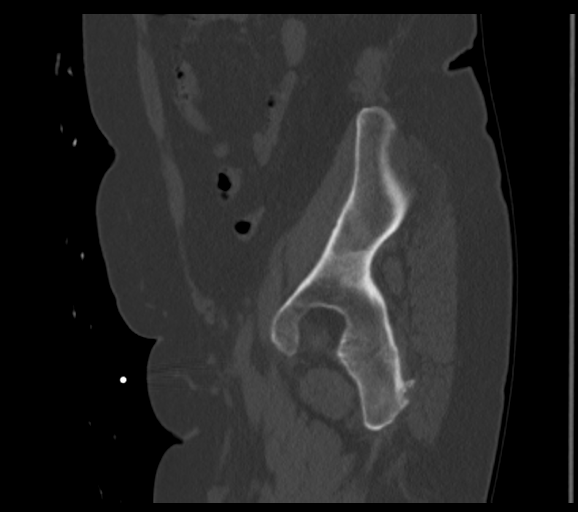

[Series 19: pelvis thin · axial · 0.98mm/px · z∈[-243,-116]mm · 4 of 499 slices shown]
[im 72/499  bone]
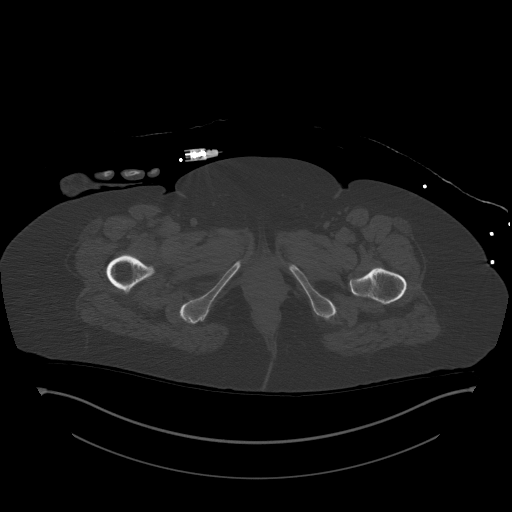
[im 143/499  bone]
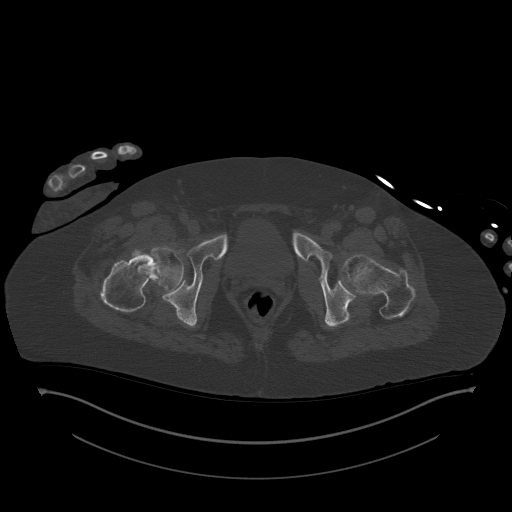
[im 214/499  bone]
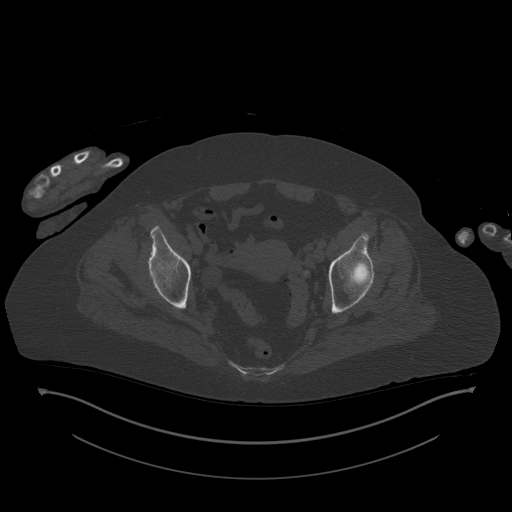
[im 285/499  bone]
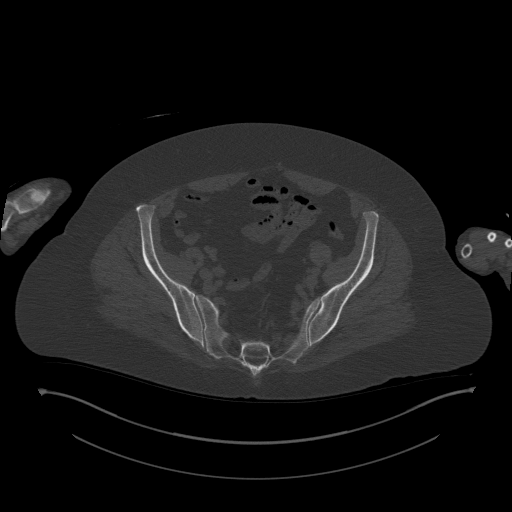

[15 of 33 positions shown; findings below may reference images not displayed]

FINDINGS: Urinary Tract:  No abnormality visualized.

Bowel:  Sigmoid colon diverticula without acute wall thickening.

Vascular/Lymphatic: Mild aortic atherosclerosis. No aneurysm. No
suspicious lymph nodes

Reproductive:  Uterus and adnexa are unremarkable

Other:  Negative for pelvic effusion or free pelvic air

Musculoskeletal: Acute right femoral neck fracture with cranial
displacement of the trochanter by about [DATE] shaft diameter. Apex
anterior angulation at the fracture site. No femoral head
dislocation. Soft tissue stranding and edema within the soft tissues
and muscles of the right hip.
IMPRESSION: 1. Acute displaced and mildly angulated right femoral neck fracture
with surrounding soft tissue swelling
2. Sigmoid colon diverticular disease without acute wall thickening
